# Patient Record
Sex: Female | Born: 1985
Health system: Southern US, Community
[De-identification: ages and names within clinical notes are randomized; demographics above are authoritative.]

## PROBLEM LIST (undated history)

## (undated) DIAGNOSIS — Z8049 Family history of malignant neoplasm of other genital organs: Secondary | ICD-10-CM

## (undated) DIAGNOSIS — Z8371 Family history of colonic polyps: Secondary | ICD-10-CM

## (undated) DIAGNOSIS — Z803 Family history of malignant neoplasm of breast: Secondary | ICD-10-CM

## (undated) DIAGNOSIS — D481 Neoplasm of uncertain behavior of connective and other soft tissue: Secondary | ICD-10-CM

## (undated) HISTORY — DX: Family history of malignant neoplasm of other genital organs: Z80.49

## (undated) HISTORY — DX: Neoplasm of uncertain behavior of connective and other soft tissue: D48.1

## (undated) HISTORY — DX: Family history of colonic polyps: Z83.71

## (undated) HISTORY — DX: Family history of malignant neoplasm of breast: Z80.3

## (undated) HISTORY — PX: NO PAST SURGERIES: SHX2092

---

## 2014-01-14 ENCOUNTER — Other Ambulatory Visit (HOSPITAL_COMMUNITY)
Admission: RE | Admit: 2014-01-14 | Discharge: 2014-01-14 | Disposition: A | Discharge status: Home / Self Care | Payer: Managed Care, Other (non HMO) | Source: Ambulatory Visit | Attending: Family Medicine | Admitting: Family Medicine

## 2014-01-14 DIAGNOSIS — Z124 Encounter for screening for malignant neoplasm of cervix: Secondary | ICD-10-CM | POA: Insufficient documentation

## 2014-04-15 ENCOUNTER — Other Ambulatory Visit: Payer: Self-pay | Admitting: Family

## 2014-04-15 DIAGNOSIS — R1032 Left lower quadrant pain: Secondary | ICD-10-CM

## 2014-04-16 ENCOUNTER — Inpatient Hospital Stay (HOSPITAL_COMMUNITY)
Admission: AD | Admit: 2014-04-16 | Discharge: 2014-04-16 | Disposition: A | Discharge status: Home / Self Care | Payer: Medicare HMO | Source: Ambulatory Visit | Attending: Family Medicine | Admitting: Family Medicine

## 2014-04-16 ENCOUNTER — Inpatient Hospital Stay (HOSPITAL_COMMUNITY): Admit: 2014-04-16 | Payer: Medicare HMO

## 2014-04-16 ENCOUNTER — Encounter (HOSPITAL_COMMUNITY): Payer: Self-pay | Admitting: *Deleted

## 2014-04-16 ENCOUNTER — Inpatient Hospital Stay (HOSPITAL_COMMUNITY): Payer: Medicare HMO

## 2014-04-16 DIAGNOSIS — N8320 Unspecified ovarian cysts: Secondary | ICD-10-CM | POA: Insufficient documentation

## 2014-04-16 DIAGNOSIS — N83201 Unspecified ovarian cyst, right side: Secondary | ICD-10-CM

## 2014-04-16 DIAGNOSIS — R1032 Left lower quadrant pain: Secondary | ICD-10-CM | POA: Insufficient documentation

## 2014-04-16 DIAGNOSIS — R109 Unspecified abdominal pain: Secondary | ICD-10-CM

## 2014-04-16 DIAGNOSIS — N83202 Unspecified ovarian cyst, left side: Secondary | ICD-10-CM

## 2014-04-16 LAB — URINALYSIS, ROUTINE W REFLEX MICROSCOPIC
Bilirubin Urine: NEGATIVE
Glucose, UA: NEGATIVE mg/dL
KETONES UR: 40 mg/dL — AB
LEUKOCYTES UA: NEGATIVE
NITRITE: NEGATIVE
Protein, ur: NEGATIVE mg/dL
SPECIFIC GRAVITY, URINE: 1.015 (ref 1.005–1.030)
Urobilinogen, UA: 0.2 mg/dL (ref 0.0–1.0)
pH: 6 (ref 5.0–8.0)

## 2014-04-16 LAB — WET PREP, GENITAL
CLUE CELLS WET PREP: NONE SEEN
TRICH WET PREP: NONE SEEN
Yeast Wet Prep HPF POC: NONE SEEN

## 2014-04-16 LAB — CBC
HEMATOCRIT: 39.2 % (ref 36.0–46.0)
Hemoglobin: 14 g/dL (ref 12.0–15.0)
MCH: 34.6 pg — ABNORMAL HIGH (ref 26.0–34.0)
MCHC: 35.7 g/dL (ref 30.0–36.0)
MCV: 96.8 fL (ref 78.0–100.0)
Platelets: 247 10*3/uL (ref 150–400)
RBC: 4.05 MIL/uL (ref 3.87–5.11)
RDW: 11.9 % (ref 11.5–15.5)
WBC: 6.4 10*3/uL (ref 4.0–10.5)

## 2014-04-16 LAB — HCG, SERUM, QUALITATIVE: PREG SERUM: NEGATIVE

## 2014-04-16 LAB — POCT PREGNANCY, URINE: PREG TEST UR: NEGATIVE

## 2014-04-16 LAB — URINE MICROSCOPIC-ADD ON

## 2014-04-16 MED ORDER — ONDANSETRON 8 MG PO TBDP
8.0000 mg | ORAL_TABLET | Freq: Once | ORAL | Status: AC
  Administered 2014-04-16: 8 mg via ORAL
  Filled 2014-04-16: qty 1

## 2014-04-16 MED ORDER — IOHEXOL 300 MG/ML  SOLN: 50.0000 mL | INTRAMUSCULAR | Status: DC

## 2014-04-16 MED ORDER — IOHEXOL 300 MG/ML  SOLN
100.0000 mL | Freq: Once | INTRAMUSCULAR | Status: AC | PRN
  Administered 2014-04-16: 100 mL via INTRAVENOUS

## 2014-04-16 MED ORDER — OXYCODONE-ACETAMINOPHEN 5-325 MG PO TABS: 2.0000 | ORAL_TABLET | ORAL | Status: DC | PRN

## 2014-04-16 MED ORDER — KETOROLAC TROMETHAMINE 60 MG/2ML IM SOLN
60.0000 mg | Freq: Once | INTRAMUSCULAR | Status: AC
  Administered 2014-04-16: 60 mg via INTRAMUSCULAR
  Filled 2014-04-16: qty 2

## 2014-04-16 MED ORDER — SODIUM CHLORIDE 0.9 % IV SOLN
INTRAVENOUS | Status: DC
  Administered 2014-04-16: 18:00:00 via INTRAVENOUS

## 2014-04-16 NOTE — MAU Note (Signed)
Off and on abd pain. Thought had gi bug few wks ago because had diarrhea. Felt better for awhile and then pain started about 10 days ago but then improved. Felt ok last wk and pain started again SUnday night. Saw Fam Pract yesterday and given Ibuprofen 800mg  and Tramadol. Scheduled for u/s this Friday. Pain med not helping and pt could not sleep last night. Was told could be ovarian cyst. Pain mostly LLQ. Took morning after pill around 1/15

## 2014-04-16 NOTE — MAU Provider Note (Signed)
History     CSN: [id number]  Arrival date and time: 04/16/14 1226   None     Chief Complaint  Patient presents with  . Abdominal Pain   HPI Pt is a 29y/o female who presents to the clinic for abdominal pain. States the pain began on Sunday while returning from a trip to [place]. She experienced LLQ pain that was moderate-severe in nature and constant. She went and saw NP at her [facility] ([place]) on Monday and discussed the possibility of an ovarian cyst. She was prescribed 800mg  Ibuprofen and Tramadol. Currently feeling discomfort. Not on any contraceptives. Reports taking the morning after pill last month. Mild nausea, no vomiting, fever. No other complaints noted.     LMP- 04/09/14    History reviewed. No pertinent past medical history.  History reviewed. No pertinent past surgical history.  History reviewed. No pertinent family history.  History  Substance Use Topics  . Smoking status: Never Smoker   . Smokeless tobacco: Never Used  . Alcohol Use: Yes    Allergies: Not on File  No prescriptions prior to admission    Review of Systems  Constitutional: Negative for fever.  Respiratory: Negative for cough and shortness of breath.   Cardiovascular: Negative for chest pain.  Gastrointestinal: Positive for nausea and abdominal pain. Negative for vomiting, diarrhea and constipation.  Genitourinary: Negative for dysuria.   Physical Exam   Blood pressure 140/94, pulse 74, temperature 98.9 F (37.2 C), resp. rate 18, height 5\' 10"  (1.778 m), weight 78.109 kg (172 lb 3.2 oz), last menstrual period 04/09/2014, SpO2 100 %.  Physical Exam  Constitutional: She is oriented to person, place, and time. She appears well-developed and well-nourished.  Cardiovascular: Normal rate and regular rhythm.   Respiratory: Effort normal and breath sounds normal.  GI: Soft. Bowel sounds are normal. She exhibits no distension and no mass. There is tenderness.  There is no rebound and no guarding.  Genitourinary: Vagina normal. Uterus is enlarged (10-12 week size broad filling base of pelvis). Uterus is not tender. Cervix exhibits no motion tenderness. Right adnexum displays tenderness. Right adnexum displays no mass. Left adnexum displays no mass and no tenderness.  Neurological: She is alert and oriented to person, place, and time.  Skin: Skin is warm and dry.    MAU Course  Procedures Results for orders placed or performed during the hospital encounter of 04/16/14 (from the past 24 hour(s))  Urinalysis, Routine w reflex microscopic     Status: Abnormal   Collection Time: 04/16/14  1:00 PM  Result Value Ref Range   Color, Urine YELLOW YELLOW   APPearance CLEAR CLEAR   Specific Gravity, Urine 1.015 1.005 - 1.030   pH 6.0 5.0 - 8.0   Glucose, UA NEGATIVE NEGATIVE mg/dL   Hgb urine dipstick TRACE (A) NEGATIVE   Bilirubin Urine NEGATIVE NEGATIVE   Ketones, ur 40 (A) NEGATIVE mg/dL   Protein, ur NEGATIVE NEGATIVE mg/dL   Urobilinogen, UA 0.2 0.0 - 1.0 mg/dL   Nitrite NEGATIVE NEGATIVE   Leukocytes, UA NEGATIVE NEGATIVE  Urine microscopic-add on     Status: Abnormal   Collection Time: 04/16/14  1:00 PM  Result Value Ref Range   Squamous Epithelial / LPF FEW (A) RARE   WBC, UA 3-6 <3 WBC/hpf   RBC / HPF 3-6 <3 RBC/hpf   Bacteria, UA FEW (A) RARE  Pregnancy, urine POC     Status: None   Collection Time: 04/16/14  1:10 PM  Result Value Ref Range   Preg Test, Ur NEGATIVE NEGATIVE  CBC     Status: Abnormal   Collection Time: 04/16/14  1:35 PM  Result Value Ref Range   WBC 6.4 4.0 - 10.5 K/uL   RBC 4.05 3.87 - 5.11 MIL/uL   Hemoglobin 14.0 12.0 - 15.0 g/dL   HCT 39.2 36.0 - 46.0 %   MCV 96.8 78.0 - 100.0 fL   MCH 34.6 (H) 26.0 - 34.0 pg   MCHC 35.7 30.0 - 36.0 g/dL   RDW 11.9 11.5 - 15.5 %   Platelets 247 150 - 400 K/uL  Wet prep, genital     Status: Abnormal   Collection Time: 04/16/14  2:11 PM  Result Value Ref Range   Yeast Wet  Prep HPF POC NONE SEEN NONE SEEN   Trich, Wet Prep NONE SEEN NONE SEEN   Clue Cells Wet Prep HPF POC NONE SEEN NONE SEEN   WBC, Wet Prep HPF POC MODERATE (A) NONE SEEN  hCG, serum, qualitative     Status: None   Collection Time: 04/16/14  2:20 PM  Result Value Ref Range   Preg, Serum NEGATIVE NEGATIVE  CA-125 pending US Transvaginal Non-ob  04/16/2014   CLINICAL DATA:  Left lower quadrant pain  EXAM: TRANSABDOMINAL AND TRANSVAGINAL ULTRASOUND OF PELVIS  TECHNIQUE: Both transabdominal and transvaginal ultrasound examinations of the pelvis were performed. Transabdominal technique was performed for global imaging of the pelvis including uterus, ovaries, adnexal regions, and pelvic cul-de-sac. It was necessary to proceed with endovaginal exam following the transabdominal exam to visualize the uterus and bilateral adnexa.  COMPARISON:  None  FINDINGS: Uterus  Measurements: 6.7 x 2.5 x 3.4 cm. No fibroids or other mass visualized.  Endometrium  Thickness: 9 mm.  No focal abnormality visualized.  Right ovary  Not discretely visualized. Shadowing echogenic/hemorrhagic lesion in the right adnexa, measuring approximately 13.9 x 7.6 x 11.9 cm. This appearance is suspicious for a dermoid.  Left ovary  Complex cystic/hemorrhagic lesion in the left adnexa, measuring 8.6 x 4.7 x 9.8 cm, with associated shadowing echogenic component (image 60). This appearance is also suspicious for a dermoid or less likely an endometrioma. Venous and arterial Doppler flow is demonstrated within adjacent ovarian parenchyma.  Other findings  No free fluid.  IMPRESSION: 13.9 cm complex right adnexal mass, suspicious for ovarian dermoid. Ovarian tissue is not identified.  9.8 cm complex cystic/solid left adnexal mass, suspicious for ovarian dermoid versus endometrioma. Patent spectral Doppler flow within adjacent ovarian parenchyma.  Consider CT or MR pelvis with contrast for further characterization.   Electronically Signed   By: Julian Hy M.D.   On: 04/16/2014 16:21   US Pelvis Complete  04/16/2014   CLINICAL DATA:  Left lower quadrant pain  EXAM: TRANSABDOMINAL AND TRANSVAGINAL ULTRASOUND OF PELVIS  TECHNIQUE: Both transabdominal and transvaginal ultrasound examinations of the pelvis were performed. Transabdominal technique was performed for global imaging of the pelvis including uterus, ovaries, adnexal regions, and pelvic cul-de-sac. It was necessary to proceed with endovaginal exam following the transabdominal exam to visualize the uterus and bilateral adnexa.  COMPARISON:  None  FINDINGS: Uterus  Measurements: 6.7 x 2.5 x 3.4 cm. No fibroids or other mass visualized.  Endometrium  Thickness: 9 mm.  No focal abnormality visualized.  Right ovary  Not discretely visualized. Shadowing echogenic/hemorrhagic lesion in the right adnexa, measuring approximately 13.9 x 7.6 x 11.9 cm. This appearance is suspicious for a dermoid.  Left ovary  Complex  cystic/hemorrhagic lesion in the left adnexa, measuring 8.6 x 4.7 x 9.8 cm, with associated shadowing echogenic component (image 60). This appearance is also suspicious for a dermoid or less likely an endometrioma. Venous and arterial Doppler flow is demonstrated within adjacent ovarian parenchyma.  Other findings  No free fluid.  IMPRESSION: 13.9 cm complex right adnexal mass, suspicious for ovarian dermoid. Ovarian tissue is not identified.  9.8 cm complex cystic/solid left adnexal mass, suspicious for ovarian dermoid versus endometrioma. Patent spectral Doppler flow within adjacent ovarian parenchyma.  Consider CT or MR pelvis with contrast for further characterization.   Electronically Signed   By: Julian Hy M.D.   On: 04/16/2014 16:21  discussed with Dr. Nehemiah Settle- will have pt have CT of abdomen and pelvis with contrast and f/u in clinic CT was performed while pt was inpatient- results pending  Assessment and Plan   bilateral ovarian cysts CT today F/u in clinic tomorrow  04/17/2014 at 3 pm to review results of imaging and management - note sent to [facility] Rx for Percocet given if needed for pain Pt has phenergan at home    Ozella Rocks, [place] 04/16/2014 at 2:25 PM

## 2014-04-17 ENCOUNTER — Ambulatory Visit (INDEPENDENT_AMBULATORY_CARE_PROVIDER_SITE_OTHER): Payer: Medicare HMO | Admitting: Obstetrics & Gynecology

## 2014-04-17 ENCOUNTER — Encounter: Payer: Self-pay | Admitting: Obstetrics & Gynecology

## 2014-04-17 VITALS — BP 125/70 | HR 77 | Temp 98.1°F | Ht 70.0 in | Wt 168.5 lb

## 2014-04-17 DIAGNOSIS — N8320 Unspecified ovarian cysts: Secondary | ICD-10-CM

## 2014-04-17 DIAGNOSIS — D271 Benign neoplasm of left ovary: Secondary | ICD-10-CM | POA: Diagnosis not present

## 2014-04-17 DIAGNOSIS — D270 Benign neoplasm of right ovary: Secondary | ICD-10-CM

## 2014-04-17 DIAGNOSIS — N83202 Unspecified ovarian cyst, left side: Principal | ICD-10-CM

## 2014-04-17 DIAGNOSIS — N83201 Unspecified ovarian cyst, right side: Secondary | ICD-10-CM

## 2014-04-17 LAB — GC/CHLAMYDIA PROBE AMP (~~LOC~~) NOT AT ARMC
Chlamydia: NEGATIVE
NEISSERIA GONORRHEA: NEGATIVE

## 2014-04-17 LAB — HIV ANTIBODY (ROUTINE TESTING W REFLEX): HIV Screen 4th Generation wRfx: NONREACTIVE

## 2014-04-17 LAB — RPR: RPR Ser Ql: NONREACTIVE

## 2014-04-17 LAB — CA 125: CA 125: 32.9 U/mL (ref 0.0–34.0)

## 2014-04-17 NOTE — Progress Notes (Signed)
Subjective:     Patient ID: Lisa Cline, female   DOB: [birth year], 29 y.o.   MRN: [id number]  HPI Pt presents as ED f/u.  She reports that she has new onset pelvic pain.   She denies fever or chills.  She reports that prior to this weekend she did not have pain.  She reports that over the past several days her appetite has decreased. She had to stop working yesterday and today due to the pain.   History reviewed. No pertinent past medical history.  Past Surgical History  Procedure Laterality Date  . No past surgeries     Current Outpatient Prescriptions on File Prior to Visit  Medication Sig Dispense Refill  . CALCIUM-MAGNESIUM-ZINC PO Take 3 tablets by mouth daily.    Marland Kitchen ibuprofen (ADVIL,MOTRIN) 800 MG tablet Take 800 mg by mouth every 8 (eight) hours as needed for mild pain.    . Multiple Vitamin (MULTIVITAMIN WITH MINERALS) TABS tablet Take 1 tablet by mouth daily.    Marland Kitchen omega-3 acid ethyl esters (LOVAZA) 1 G capsule Take 2 g by mouth daily.    Marland Kitchen oxyCODONE-acetaminophen (PERCOCET/ROXICET) 5-325 MG per tablet Take 2 tablets by mouth every 4 (four) hours as needed for severe pain. 15 tablet 0  . Probiotic Product (PROBIOTIC PO) Take 1 capsule by mouth daily.    . traMADol (ULTRAM) 50 MG tablet Take 50 mg by mouth every 6 (six) hours as needed for moderate pain.     No current facility-administered medications on file prior to visit.   History   Social History  . Marital Status: Unknown    Spouse Name: N/A  . Number of Children: N/A  . Years of Education: N/A   Occupational History  . Not on file.   Social History Main Topics  . Smoking status: Never Smoker   . Smokeless tobacco: Never Used  . Alcohol Use: Yes     Comment: socially  . Drug Use: No  . Sexual Activity: Yes    Birth Control/ Protection: Condom   Other Topics Concern  . Not on file   Social History Narrative   No Known Allergies      Review of Systems     Objective:   Physical Exam BP 125/70  mmHg  Pulse 77  Temp(Src) 98.1 F (36.7 C) (Oral)  Ht 5\' 10"  (1.778 m)  Wt 168 lb 8 oz (76.431 kg)  BMI 24.18 kg/m2  LMP 04/09/2014 Pt in NAD Lungs: CTA CV: RRR Abd: soft, ND, mass effect palpated.  GU: EGBUS: no lesions Vagina: no blood in vault Cervix: no lesion; no mucopurulent d/c; no CMT Uterus: small, mobile Adnexa: mobile masses palpated bilaterally; pt uncomfortable after exam; adnexa tender to palpation      04/16/2014 CLINICAL DATA: Left lower quadrant pain  EXAM: TRANSABDOMINAL AND TRANSVAGINAL ULTRASOUND OF PELVIS  TECHNIQUE: Both transabdominal and transvaginal ultrasound examinations of the pelvis were performed. Transabdominal technique was performed for global imaging of the pelvis including uterus, ovaries, adnexal regions, and pelvic cul-de-sac. It was necessary to proceed with endovaginal exam following the transabdominal exam to visualize the uterus and bilateral adnexa.  COMPARISON: None  FINDINGS: Uterus  Measurements: 6.7 x 2.5 x 3.4 cm. No fibroids or other mass visualized.  Endometrium  Thickness: 9 mm. No focal abnormality visualized.  Right ovary  Not discretely visualized. Shadowing echogenic/hemorrhagic lesion in the right adnexa, measuring approximately 13.9 x 7.6 x 11.9 cm. This appearance is suspicious for a dermoid.  Left  ovary  Complex cystic/hemorrhagic lesion in the left adnexa, measuring 8.6 x 4.7 x 9.8 cm, with associated shadowing echogenic component (image 60). This appearance is also suspicious for a dermoid or less likely an endometrioma. Venous and arterial Doppler flow is demonstrated within adjacent ovarian parenchyma.  Other findings  No free fluid.  IMPRESSION: 13.9 cm complex right adnexal mass, suspicious for ovarian dermoid. Ovarian tissue is not identified.  9.8 cm complex cystic/solid left adnexal mass, suspicious for ovarian dermoid versus endometrioma. Patent spectral Doppler  flow within adjacent ovarian parenchyma.  Consider CT or MR pelvis with contrast for further characterization.   04/16/2014 CLINICAL DATA: Low abdominal pain for 1 month, worsening over 3 days. Right adnexal mass suspicious for ovarian dermoid demonstrated on ultrasound today.  EXAM: CT ABDOMEN AND PELVIS WITH CONTRAST  TECHNIQUE: Multidetector CT imaging of the abdomen and pelvis was performed using the standard protocol following bolus administration of intravenous contrast.  CONTRAST: 185mL OMNIPAQUE IOHEXOL 300 MG/ML SOLN  COMPARISON: Ultrasound pelvis 04/16/2014  FINDINGS: Lung bases are clear.  The liver, spleen, gallbladder, pancreas, adrenal glands, kidneys, abdominal aorta, inferior vena cava, and retroperitoneal lymph nodes are unremarkable. Stomach, small bowel, and colon are not abnormally distended. No free air or free fluid in the abdomen.  Pelvis: There is a large anterior mass in the pelvis appearing to arise from the right adnexal region. The mass contains fat fluid level with internal debris and calcification consistent with a dermoid cyst. The lesion measures about 10.2 x 8.5 cm in transverse diameter. Additional fat and soft tissue changes demonstrated posterior to the uterus and measuring about 8.8 x 5.8 cm. This may be lobular continuation of the right adnexal lesion or could represent an additional left adnexal dermoid. No free or loculated pelvic fluid collections. The appendix is normal. No destructive bone lesions.  IMPRESSION: Large anterior and posterior pelvic mass lesions containing fat, soft tissue, internal debris, and calcification. Appearance likely represents bilateral ovarian dermoid cysts.      Assessment:    Bilateral large dermoid cysts-  Reviewed with pt necessity of operative treatment as these are not functional cysts     Plan:     CA 125 today Patient desires surgical management with laparotomy with  bilateral ovarian cystectomies.  The risks of surgery were discussed in detail with the patient including but not limited to: bleeding which may require transfusion or reoperation; infection which may require prolonged hospitalization or re-hospitalization and antibiotic therapy; injury to bowel, bladder, ureters and major vessels or other surrounding organs; need for additional procedures including laparotomy; thromboembolic phenomenon, incisional problems and other postoperative or anesthesia complications.  Patient was told that the likelihood that her condition and symptoms will be treated effectively with this surgical management was very high; the postoperative expectations were also discussed in detail. The patient also understands the alternative treatment options which were discussed in full. All questions were answered.  She was told that she will be contacted by our surgical scheduler regarding the time and date of her surgery; routine preoperative instructions of having nothing to eat or drink after midnight on the day prior to surgery and also coming to the hospital 1 1/2 hours prior to her time of surgery were also emphasized.  She was told she may be called for a preoperative appointment about a week prior to surgery and will be given further preoperative instructions at that visit. Printed patient education handouts about the procedure were given to the patient to review at  home.

## 2014-04-17 NOTE — Patient Instructions (Addendum)
Laparotomy Care After Refer to this sheet in the next few weeks. These instructions provide you with information on caring for yourself after your procedure. Your caregiver may also give you more specific instructions. Your treatment has been planned according to current medical practices, but problems sometimes occur. Call your caregiver if you have any problems or questions after your procedure. HOME CARE INSTRUCTIONS ACTIVITY  Rest as much as possible the first two weeks at home.  Avoid strenuous activity such as heavy lifting (more than 10 pounds), pushing, or pulling. Limit stair climbing to once or twice a day for the first week, then slowly increase this activity.  Take frequent rest periods throughout the day.  Talk with your caregiver about when you may resume your usual physical activity.  You need to be out of bed and walking as much as possible. This decreases the chance of:  Blood clots.  Pneumonia. NUTRITION  You can resume your normal diet once you regain bowel function.  Drink plenty of fluids (6-8 glasses a day or as instructed by your caregiver).  Eat a well-balanced diet.  Daily portions of food from the meat (protein), milk, vegetable, and bread groups are necessary for your health. ELIMINATION It is very important not to strain during bowel movements. If constipation should occur, you may:  Take a mild laxative.  Add fruit and bran to your diet.  Drink more fluids. HYGIENE  Take showers, not baths, until 4-6 weeks after surgery.  If your incision is closed, you may take a shower or tub bath. FEVER If you feel feverish or have shaking chills, take your temperature. If it is 102 F (38.9 C), call your caregiver. The fever may mean there is an infection. PAIN CONTROL  Mild discomfort may occur.  Only take over-the-counter or prescription medicines for pain, discomfort, or fever as directed by your caregiver. Take any prescribed medicines exactly as  directed. INCISION CARE  Keep your incision site clean with soap and water.  Do not use a dressing unless your cut (incision) from surgery is draining or irritated.  If you have small adhesive strips in place and they do not fall off within 10 days, carefully peel them off.  Check your incision and surrounding area daily for any redness, swelling, discoloration, heavy drainage, or separation of the skin. SEXUAL INTERCOURSE Do not have sexual intercourse until after your follow-up appointment, unless your caregiver tells you otherwise. SEEK MEDICAL CARE IF:   You are unable to tolerate food or drinks.  You are unable to pass gas or have a bowel movement.  Your pain becomes more severe or is not relieved with medicines.  You have redness, swelling, discoloration, heavy drainage, or separation of the skin at the incision site. Document Released: 09/23/2003 Document Revised: 01/26/2012 Document Reviewed: 02/07/2007 [facility] Patient Information 2015 [place], [place]. This information is not intended to replace advice given to you by your health care provider. Make sure you discuss any questions you have with your health care provider. Ovarian Cyst An ovarian cyst is a fluid-filled sac that forms on an ovary. The ovaries are small organs that produce eggs in women. Various types of cysts can form on the ovaries. Most are not cancerous. Many do not cause problems, and they often go away on their own. Some may cause symptoms and require treatment. Common types of ovarian cysts include:  Functional cysts--These cysts may occur every month during the menstrual cycle. This is normal. The cysts usually go away with  the next menstrual cycle if the woman does not get pregnant. Usually, there are no symptoms with a functional cyst.  Endometrioma cysts--These cysts form from the tissue that lines the uterus. They are also called "chocolate cysts" because they become filled with blood that turns brown.  This type of cyst can cause pain in the lower abdomen during intercourse and with your menstrual period.  Cystadenoma cysts--This type develops from the cells on the outside of the ovary. These cysts can get very big and cause lower abdomen pain and pain with intercourse. This type of cyst can twist on itself, cut off its blood supply, and cause severe pain. It can also easily rupture and cause a lot of pain.  Dermoid cysts--This type of cyst is sometimes found in both ovaries. These cysts may contain different kinds of body tissue, such as skin, teeth, hair, or cartilage. They usually do not cause symptoms unless they get very big.  Theca lutein cysts--These cysts occur when too much of a certain hormone (human chorionic gonadotropin) is produced and overstimulates the ovaries to produce an egg. This is most common after procedures used to assist with the conception of a baby (in vitro fertilization). CAUSES   Fertility drugs can cause a condition in which multiple large cysts are formed on the ovaries. This is called ovarian hyperstimulation syndrome.  A condition called polycystic ovary syndrome can cause hormonal imbalances that can lead to nonfunctional ovarian cysts. SIGNS AND SYMPTOMS  Many ovarian cysts do not cause symptoms. If symptoms are present, they may include:  Pelvic pain or pressure.  Pain in the lower abdomen.  Pain during sexual intercourse.  Increasing girth (swelling) of the abdomen.  Abnormal menstrual periods.  Increasing pain with menstrual periods.  Stopping having menstrual periods without being pregnant. DIAGNOSIS  These cysts are commonly found during a routine or annual pelvic exam. Tests may be ordered to find out more about the cyst. These tests may include:  Ultrasound.  X-ray of the pelvis.  CT scan.  MRI.  Blood tests. TREATMENT  Many ovarian cysts go away on their own without treatment. Your health care provider may want to check your cyst  regularly for 2-3 months to see if it changes. For women in menopause, it is particularly important to monitor a cyst closely because of the higher rate of ovarian cancer in menopausal women. When treatment is needed, it may include any of the following:  A procedure to drain the cyst (aspiration). This may be done using a long needle and ultrasound. It can also be done through a laparoscopic procedure. This involves using a thin, lighted tube with a tiny camera on the end (laparoscope) inserted through a small incision.  Surgery to remove the whole cyst. This may be done using laparoscopic surgery or an open surgery involving a larger incision in the lower abdomen.  Hormone treatment or birth control pills. These methods are sometimes used to help dissolve a cyst. HOME CARE INSTRUCTIONS   Only take over-the-counter or prescription medicines as directed by your health care provider.  Follow up with your health care provider as directed.  Get regular pelvic exams and Pap tests. SEEK MEDICAL CARE IF:   Your periods are late, irregular, or painful, or they stop.  Your pelvic pain or abdominal pain does not go away.  Your abdomen becomes larger or swollen.  You have pressure on your bladder or trouble emptying your bladder completely.  You have pain during sexual intercourse.  You have feelings of fullness, pressure, or discomfort in your stomach.  You lose weight for no apparent reason.  You feel generally ill.  You become constipated.  You lose your appetite.  You develop acne.  You have an increase in body and facial hair.  You are gaining weight, without changing your exercise and eating habits.  You think you are pregnant. SEEK IMMEDIATE MEDICAL CARE IF:   You have increasing abdominal pain.  You feel sick to your stomach (nauseous), and you throw up (vomit).  You develop a fever that comes on suddenly.  You have abdominal pain during a bowel movement.  Your  menstrual periods become heavier than usual. MAKE SURE YOU:  Understand these instructions.  Will watch your condition.  Will get help right away if you are not doing well or get worse. Document Released: 02/08/2005 Document Revised: 02/13/2013 Document Reviewed: 10/16/2012 [facility] Patient Information 2015 [place], [place]. This information is not intended to replace advice given to you by your health care provider. Make sure you discuss any questions you have with your health care provider.

## 2014-04-18 ENCOUNTER — Encounter (HOSPITAL_COMMUNITY): Payer: Self-pay | Admitting: *Deleted

## 2014-04-18 DIAGNOSIS — D270 Benign neoplasm of right ovary: Secondary | ICD-10-CM | POA: Insufficient documentation

## 2014-04-18 DIAGNOSIS — D271 Benign neoplasm of left ovary: Secondary | ICD-10-CM

## 2014-04-18 LAB — CA 125: CA 125: 27 U/mL (ref <35)

## 2014-04-19 ENCOUNTER — Other Ambulatory Visit: Payer: Managed Care, Other (non HMO)

## 2014-04-19 ENCOUNTER — Telehealth: Payer: Self-pay | Admitting: *Deleted

## 2014-04-19 ENCOUNTER — Other Ambulatory Visit: Payer: Medicare HMO

## 2014-04-19 NOTE — Telephone Encounter (Addendum)
Received a voicemail message from [place] from [place] stating she is calling regarding this patient who is a patient of Dr. Lavonia Drafts. States the patient has taken a leave and she needs to verify medical information so she can get her leave.  Needs Diagnosis and ICD 10 codes, tests completed verifying diagnosis, treatment and plan of care and dates of visits.   Called [place] and informed her we need a signed release before we can release any medical information.

## 2014-04-22 MED ORDER — DEXTROSE 5 % IV SOLN
2.0000 g | INTRAVENOUS | Status: AC
  Administered 2014-04-23: 2 g via INTRAVENOUS
  Filled 2014-04-22: qty 2

## 2014-04-23 ENCOUNTER — Inpatient Hospital Stay (HOSPITAL_COMMUNITY): Payer: Managed Care, Other (non HMO) | Admitting: Anesthesiology

## 2014-04-23 ENCOUNTER — Inpatient Hospital Stay (HOSPITAL_COMMUNITY)
Admission: RE | Admit: 2014-04-23 | Discharge: 2014-04-25 | DRG: 742 | Disposition: A | Discharge status: Home / Self Care | Payer: Managed Care, Other (non HMO) | Source: Ambulatory Visit | Attending: Obstetrics & Gynecology | Admitting: Obstetrics & Gynecology

## 2014-04-23 ENCOUNTER — Encounter (HOSPITAL_COMMUNITY): Payer: Self-pay | Admitting: Obstetrics & Gynecology

## 2014-04-23 ENCOUNTER — Encounter (HOSPITAL_COMMUNITY)
Admission: RE | Disposition: A | Discharge status: Home / Self Care | Payer: Self-pay | Source: Ambulatory Visit | Attending: Obstetrics & Gynecology

## 2014-04-23 DIAGNOSIS — D270 Benign neoplasm of right ovary: Secondary | ICD-10-CM | POA: Diagnosis present

## 2014-04-23 DIAGNOSIS — D271 Benign neoplasm of left ovary: Principal | ICD-10-CM | POA: Diagnosis present

## 2014-04-23 DIAGNOSIS — N8353 Torsion of ovary, ovarian pedicle and fallopian tube: Secondary | ICD-10-CM | POA: Diagnosis present

## 2014-04-23 DIAGNOSIS — Z9889 Other specified postprocedural states: Secondary | ICD-10-CM

## 2014-04-23 HISTORY — PX: LAPAROTOMY: SHX154

## 2014-04-23 HISTORY — PX: OVARIAN CYST REMOVAL: SHX89

## 2014-04-23 HISTORY — PX: SALPINGOOPHORECTOMY: SHX82

## 2014-04-23 LAB — CBC
HCT: 33.5 % — ABNORMAL LOW (ref 36.0–46.0)
Hemoglobin: 12.2 g/dL (ref 12.0–15.0)
MCH: 34.3 pg — AB (ref 26.0–34.0)
MCHC: 36.4 g/dL — ABNORMAL HIGH (ref 30.0–36.0)
MCV: 94.1 fL (ref 78.0–100.0)
Platelets: 280 10*3/uL (ref 150–400)
RBC: 3.56 MIL/uL — ABNORMAL LOW (ref 3.87–5.11)
RDW: 11.5 % (ref 11.5–15.5)
WBC: 10.7 10*3/uL — AB (ref 4.0–10.5)

## 2014-04-23 LAB — TYPE AND SCREEN
ABO/RH(D): O POS
Antibody Screen: NEGATIVE

## 2014-04-23 LAB — ABO/RH: ABO/RH(D): O POS

## 2014-04-23 SURGERY — LAPAROTOMY
Anesthesia: General | Site: Abdomen | Laterality: Right

## 2014-04-23 MED ORDER — HYDROMORPHONE HCL 1 MG/ML IJ SOLN
INTRAMUSCULAR | Status: AC
  Filled 2014-04-23: qty 1

## 2014-04-23 MED ORDER — ONDANSETRON HCL 4 MG/2ML IJ SOLN: 4.0000 mg | Freq: Four times a day (QID) | INTRAMUSCULAR | Status: DC | PRN

## 2014-04-23 MED ORDER — BUPIVACAINE LIPOSOME 1.3 % IJ SUSP
20.0000 mL | Freq: Once | INTRAMUSCULAR | Status: DC
  Filled 2014-04-23: qty 20

## 2014-04-23 MED ORDER — FENTANYL CITRATE 0.05 MG/ML IJ SOLN
INTRAMUSCULAR | Status: AC
  Administered 2014-04-23: 50 ug via INTRAVENOUS
  Filled 2014-04-23: qty 2

## 2014-04-23 MED ORDER — BUPIVACAINE HCL (PF) 0.5 % IJ SOLN
INTRAMUSCULAR | Status: AC
  Filled 2014-04-23: qty 30

## 2014-04-23 MED ORDER — LACTATED RINGERS IV SOLN: INTRAVENOUS | Status: DC

## 2014-04-23 MED ORDER — FENTANYL CITRATE 0.05 MG/ML IJ SOLN
INTRAMUSCULAR | Status: DC | PRN
  Administered 2014-04-23 (×3): 50 ug via INTRAVENOUS
  Administered 2014-04-23 (×2): 100 ug via INTRAVENOUS

## 2014-04-23 MED ORDER — NEOSTIGMINE METHYLSULFATE 10 MG/10ML IV SOLN
INTRAVENOUS | Status: AC
  Filled 2014-04-23: qty 1

## 2014-04-23 MED ORDER — FENTANYL CITRATE 0.05 MG/ML IJ SOLN
INTRAMUSCULAR | Status: AC
  Filled 2014-04-23: qty 5

## 2014-04-23 MED ORDER — LACTATED RINGERS IV SOLN
INTRAVENOUS | Status: DC
  Administered 2014-04-23 (×2): via INTRAVENOUS

## 2014-04-23 MED ORDER — FENTANYL CITRATE 0.05 MG/ML IJ SOLN
INTRAMUSCULAR | Status: AC
  Filled 2014-04-23: qty 2

## 2014-04-23 MED ORDER — ONDANSETRON HCL 4 MG/2ML IJ SOLN: 4.0000 mg | Freq: Once | INTRAMUSCULAR | Status: DC | PRN

## 2014-04-23 MED ORDER — OXYCODONE-ACETAMINOPHEN 5-325 MG PO TABS
1.0000 | ORAL_TABLET | ORAL | Status: DC | PRN
  Administered 2014-04-24 – 2014-04-25 (×3): 1 via ORAL
  Filled 2014-04-23 (×3): qty 1

## 2014-04-23 MED ORDER — ONDANSETRON HCL 4 MG/2ML IJ SOLN
INTRAMUSCULAR | Status: AC
  Filled 2014-04-23: qty 2

## 2014-04-23 MED ORDER — PROPOFOL 10 MG/ML IV BOLUS
INTRAVENOUS | Status: AC
  Filled 2014-04-23: qty 20

## 2014-04-23 MED ORDER — BUPIVACAINE HCL (PF) 0.25 % IJ SOLN
INTRAMUSCULAR | Status: AC
  Filled 2014-04-23: qty 30

## 2014-04-23 MED ORDER — SCOPOLAMINE 1 MG/3DAYS TD PT72
1.0000 | MEDICATED_PATCH | Freq: Once | TRANSDERMAL | Status: DC
  Administered 2014-04-23: 1.5 mg via TRANSDERMAL

## 2014-04-23 MED ORDER — HYDROMORPHONE HCL 1 MG/ML IJ SOLN: 0.2000 mg | INTRAMUSCULAR | Status: DC | PRN

## 2014-04-23 MED ORDER — DEXAMETHASONE SODIUM PHOSPHATE 10 MG/ML IJ SOLN
INTRAMUSCULAR | Status: AC
  Filled 2014-04-23: qty 1

## 2014-04-23 MED ORDER — GLYCOPYRROLATE 0.2 MG/ML IJ SOLN
INTRAMUSCULAR | Status: DC | PRN
  Administered 2014-04-23: 0.2 mg via INTRAVENOUS
  Administered 2014-04-23: .6 mg via INTRAVENOUS

## 2014-04-23 MED ORDER — SCOPOLAMINE 1 MG/3DAYS TD PT72
MEDICATED_PATCH | TRANSDERMAL | Status: DC
Start: 2014-04-23 — End: 2014-04-25
  Administered 2014-04-23: 1.5 mg via TRANSDERMAL
  Filled 2014-04-23: qty 1

## 2014-04-23 MED ORDER — IBUPROFEN 800 MG PO TABS
800.0000 mg | ORAL_TABLET | Freq: Three times a day (TID) | ORAL | Status: DC | PRN
  Administered 2014-04-24 – 2014-04-25 (×2): 800 mg via ORAL
  Filled 2014-04-23 (×2): qty 1

## 2014-04-23 MED ORDER — ONDANSETRON HCL 4 MG PO TABS: 4.0000 mg | ORAL_TABLET | Freq: Four times a day (QID) | ORAL | Status: DC | PRN

## 2014-04-23 MED ORDER — KETOROLAC TROMETHAMINE 30 MG/ML IJ SOLN: 30.0000 mg | Freq: Four times a day (QID) | INTRAMUSCULAR | Status: DC

## 2014-04-23 MED ORDER — LIDOCAINE HCL (CARDIAC) 20 MG/ML IV SOLN
INTRAVENOUS | Status: AC
Start: 2014-04-23 — End: 2014-04-23
  Filled 2014-04-23: qty 5

## 2014-04-23 MED ORDER — NEOSTIGMINE METHYLSULFATE 10 MG/10ML IV SOLN
INTRAVENOUS | Status: DC | PRN
  Administered 2014-04-23: 4 mg via INTRAVENOUS

## 2014-04-23 MED ORDER — 0.9 % SODIUM CHLORIDE (POUR BTL) OPTIME
TOPICAL | Status: DC | PRN
  Administered 2014-04-23: 2000 mL

## 2014-04-23 MED ORDER — PROPOFOL 10 MG/ML IV BOLUS
INTRAVENOUS | Status: DC | PRN
  Administered 2014-04-23: 150 mg via INTRAVENOUS

## 2014-04-23 MED ORDER — BUPIVACAINE HCL (PF) 0.25 % IJ SOLN
INTRAMUSCULAR | Status: DC | PRN
  Administered 2014-04-23: 10 mL

## 2014-04-23 MED ORDER — MIDAZOLAM HCL 2 MG/2ML IJ SOLN
INTRAMUSCULAR | Status: AC
  Filled 2014-04-23: qty 2

## 2014-04-23 MED ORDER — ONDANSETRON HCL 4 MG/2ML IJ SOLN
INTRAMUSCULAR | Status: DC | PRN
  Administered 2014-04-23: 4 mg via INTRAVENOUS

## 2014-04-23 MED ORDER — KETOROLAC TROMETHAMINE 30 MG/ML IJ SOLN
30.0000 mg | Freq: Four times a day (QID) | INTRAMUSCULAR | Status: DC
  Administered 2014-04-24 (×3): 30 mg via INTRAVENOUS
  Filled 2014-04-23 (×3): qty 1

## 2014-04-23 MED ORDER — DOCUSATE SODIUM 100 MG PO CAPS
100.0000 mg | ORAL_CAPSULE | Freq: Two times a day (BID) | ORAL | Status: DC
  Administered 2014-04-23 – 2014-04-25 (×4): 100 mg via ORAL
  Filled 2014-04-23 (×4): qty 1

## 2014-04-23 MED ORDER — SODIUM CHLORIDE 0.9 % IJ SOLN
INTRAMUSCULAR | Status: AC
  Filled 2014-04-23: qty 10

## 2014-04-23 MED ORDER — FENTANYL CITRATE 0.05 MG/ML IJ SOLN
25.0000 ug | INTRAMUSCULAR | Status: DC | PRN
  Administered 2014-04-23 (×2): 50 ug via INTRAVENOUS

## 2014-04-23 MED ORDER — PANTOPRAZOLE SODIUM 40 MG PO TBEC
40.0000 mg | DELAYED_RELEASE_TABLET | Freq: Every day | ORAL | Status: DC
Start: 2014-04-24 — End: 2014-04-25
  Administered 2014-04-24 – 2014-04-25 (×2): 40 mg via ORAL
  Filled 2014-04-23 (×2): qty 1

## 2014-04-23 MED ORDER — GLYCOPYRROLATE 0.2 MG/ML IJ SOLN
INTRAMUSCULAR | Status: AC
  Filled 2014-04-23: qty 4

## 2014-04-23 MED ORDER — DEXTROSE-NACL 5-0.45 % IV SOLN
INTRAVENOUS | Status: DC
  Administered 2014-04-23 – 2014-04-24 (×2): via INTRAVENOUS

## 2014-04-23 MED ORDER — KETOROLAC TROMETHAMINE 30 MG/ML IJ SOLN
INTRAMUSCULAR | Status: AC
  Filled 2014-04-23: qty 1

## 2014-04-23 MED ORDER — DEXAMETHASONE SODIUM PHOSPHATE 10 MG/ML IJ SOLN
INTRAMUSCULAR | Status: DC | PRN
  Administered 2014-04-23: 4 mg via INTRAVENOUS

## 2014-04-23 MED ORDER — ROCURONIUM BROMIDE 100 MG/10ML IV SOLN
INTRAVENOUS | Status: AC
  Filled 2014-04-23: qty 1

## 2014-04-23 MED ORDER — MIDAZOLAM HCL 2 MG/2ML IJ SOLN
INTRAMUSCULAR | Status: DC | PRN
  Administered 2014-04-23: 2 mg via INTRAVENOUS

## 2014-04-23 MED ORDER — SIMETHICONE 80 MG PO CHEW
80.0000 mg | CHEWABLE_TABLET | Freq: Four times a day (QID) | ORAL | Status: DC | PRN
  Administered 2014-04-23 – 2014-04-24 (×2): 80 mg via ORAL
  Filled 2014-04-23 (×2): qty 1

## 2014-04-23 MED ORDER — BUPIVACAINE LIPOSOME 1.3 % IJ SUSP
INTRAMUSCULAR | Status: DC | PRN
  Administered 2014-04-23: 20 mL

## 2014-04-23 MED ORDER — KETOROLAC TROMETHAMINE 30 MG/ML IJ SOLN
30.0000 mg | Freq: Once | INTRAMUSCULAR | Status: AC
  Administered 2014-04-23: 30 mg via INTRAVENOUS

## 2014-04-23 MED ORDER — HYDROMORPHONE HCL 1 MG/ML IJ SOLN
INTRAMUSCULAR | Status: DC | PRN
  Administered 2014-04-23: 1 mg via INTRAVENOUS

## 2014-04-23 MED ORDER — ROCURONIUM BROMIDE 100 MG/10ML IV SOLN
INTRAVENOUS | Status: DC | PRN
  Administered 2014-04-23 (×2): 10 mg via INTRAVENOUS
  Administered 2014-04-23: 40 mg via INTRAVENOUS

## 2014-04-23 SURGICAL SUPPLY — 45 items
BARRIER ADHS 3X4 INTERCEED (GAUZE/BANDAGES/DRESSINGS) ×5 IMPLANT
BENZOIN TINCTURE PRP APPL 2/3 (GAUZE/BANDAGES/DRESSINGS) ×5 IMPLANT
BLADE SURG 10 STRL SS (BLADE) ×10 IMPLANT
CANISTER SUCT 3000ML (MISCELLANEOUS) ×5 IMPLANT
CELLS DAT CNTRL 66122 CELL SVR (MISCELLANEOUS) IMPLANT
CHLORAPREP W/TINT 26ML (MISCELLANEOUS) ×5 IMPLANT
CLOSURE WOUND 1/2 X4 (GAUZE/BANDAGES/DRESSINGS) ×1
CLOTH BEACON ORANGE TIMEOUT ST (SAFETY) ×5 IMPLANT
CONT PATH 16OZ SNAP LID 3702 (MISCELLANEOUS) ×5 IMPLANT
DECANTER SPIKE VIAL GLASS SM (MISCELLANEOUS) IMPLANT
DRAPE WARM FLUID 44X44 (DRAPE) ×5 IMPLANT
DRSG OPSITE POSTOP 4X10 (GAUZE/BANDAGES/DRESSINGS) ×5 IMPLANT
DRSG TELFA 3X8 NADH (GAUZE/BANDAGES/DRESSINGS) ×5 IMPLANT
GAUZE SPONGE 4X4 12PLY STRL (GAUZE/BANDAGES/DRESSINGS) ×5 IMPLANT
GAUZE SPONGE 4X4 16PLY XRAY LF (GAUZE/BANDAGES/DRESSINGS) ×5 IMPLANT
GLOVE BIO SURGEON STRL SZ7 (GLOVE) ×5 IMPLANT
GLOVE BIOGEL PI IND STRL 7.0 (GLOVE) ×3 IMPLANT
GLOVE BIOGEL PI INDICATOR 7.0 (GLOVE) ×2
GOWN STRL REUS W/TWL LRG LVL3 (GOWN DISPOSABLE) ×10 IMPLANT
NEEDLE HYPO 25X1 1.5 SAFETY (NEEDLE) ×5 IMPLANT
NS IRRIG 1000ML POUR BTL (IV SOLUTION) ×5 IMPLANT
PACK ABDOMINAL GYN (CUSTOM PROCEDURE TRAY) ×5 IMPLANT
PAD ABD 7.5X8 STRL (GAUZE/BANDAGES/DRESSINGS) ×10 IMPLANT
PAD OB MATERNITY 4.3X12.25 (PERSONAL CARE ITEMS) ×5 IMPLANT
PROTECTOR NERVE ULNAR (MISCELLANEOUS) ×5 IMPLANT
RETRACTOR WND ALEXIS 25 LRG (MISCELLANEOUS) IMPLANT
RTRCTR WOUND ALEXIS 18CM MED (MISCELLANEOUS)
RTRCTR WOUND ALEXIS 25CM LRG (MISCELLANEOUS)
SPONGE LAP 18X18 X RAY DECT (DISPOSABLE) ×10 IMPLANT
STAPLER VISISTAT 35W (STAPLE) ×5 IMPLANT
STRIP CLOSURE SKIN 1/2X4 (GAUZE/BANDAGES/DRESSINGS) ×4 IMPLANT
SUT VIC AB 0 CT1 18XCR BRD8 (SUTURE) ×3 IMPLANT
SUT VIC AB 0 CT1 27 (SUTURE) ×4
SUT VIC AB 0 CT1 27XBRD ANBCTR (SUTURE) ×6 IMPLANT
SUT VIC AB 0 CT1 8-18 (SUTURE) ×2
SUT VIC AB 3-0 CT1 27 (SUTURE) ×6
SUT VIC AB 3-0 CT1 TAPERPNT 27 (SUTURE) ×9 IMPLANT
SUT VIC AB 3-0 SH 27 (SUTURE)
SUT VIC AB 3-0 SH 27X BRD (SUTURE) IMPLANT
SUT VIC AB 4-0 KS 27 (SUTURE) ×5 IMPLANT
SUT VICRYL 0 TIES 12 18 (SUTURE) ×5 IMPLANT
SYR CONTROL 10ML LL (SYRINGE) ×5 IMPLANT
TOWEL OR 17X24 6PK STRL BLUE (TOWEL DISPOSABLE) ×10 IMPLANT
TRAY FOLEY CATH 14FR (SET/KITS/TRAYS/PACK) ×5 IMPLANT
WATER STERILE IRR 1000ML POUR (IV SOLUTION) ×5 IMPLANT

## 2014-04-23 NOTE — Brief Op Note (Addendum)
04/23/2014  5:16 PM  PATIENT:  Lisa Cline  29 y.o. female  PRE-OPERATIVE DIAGNOSIS:   Bilateral ovarian dermoid cysts  POST-OPERATIVE DIAGNOSIS:   Left ovarian dermoid with torsion and infarcted tube and ovary and right dermoid cyst   PROCEDURE: laparotomy with Left salpingo-oophorectomy and right ovarian cystectomy    SURGEON:  Surgeon(s) and Role:    * Lavonia Drafts, MD - Primary  ANESTHESIA:   general  EBL:  Total I/O In: 1000 [I.V.:1000] Out: 200 [Urine:75; Blood:125]  BLOOD ADMINISTERED:none  DRAINS: none   LOCAL MEDICATIONS USED:  OTHER Exparel with Bupivicaone  SPECIMEN:  Source of Specimen:  left salpingoophorectomy and right cyst   DISPOSITION OF SPECIMEN:  PATHOLOGY  COUNTS:  YES  TOURNIQUET:  * No tourniquets in log *  DICTATION: .Note written in EPIC  PLAN OF CARE: Admit to inpatient   PATIENT DISPOSITION:  PACU - hemodynamically stable.   Delay start of Pharmacological VTE agent (>24hrs) due to surgical blood loss or risk of bleeding: yes

## 2014-04-23 NOTE — Anesthesia Postprocedure Evaluation (Signed)
  Anesthesia Post-op Note  Patient: Lisa Cline  Procedure(s) Performed: Procedure(s) (LRB): LAPAROTOMY (N/A) OVARIAN CYSTECTOMY (Left) SALPINGO OOPHORECTOMY (Right)  Patient Location: PACU  Anesthesia Type: General  Level of Consciousness: awake and alert   Airway and Oxygen Therapy: Patient Spontanous Breathing  Post-op Pain: mild  Post-op Assessment: Post-op Vital signs reviewed, Patient's Cardiovascular Status Stable, Respiratory Function Stable, Patent Airway and No signs of Nausea or vomiting  Last Vitals:  Filed Vitals:   04/23/14 1723  BP: 124/85  Pulse: 104  Temp: 36.9 C  Resp: 12    Post-op Vital Signs: stable   Complications: No apparent anesthesia complications

## 2014-04-23 NOTE — Transfer of Care (Signed)
Immediate Anesthesia Transfer of Care Note  Patient: Lisa Cline  Procedure(s) Performed: Procedure(s): LAPAROTOMY (N/A) OVARIAN CYSTECTOMY (Left) SALPINGO OOPHORECTOMY (Right)  Patient Location: PACU  Anesthesia Type:General  Level of Consciousness: awake, alert  and oriented  Airway & Oxygen Therapy: Patient Spontanous Breathing and Patient connected to nasal cannula oxygen  Post-op Assessment: Report given to RN and Post -op Vital signs reviewed and stable  Post vital signs: Reviewed and stable  Last Vitals:  Filed Vitals:   04/23/14 1404  BP: 122/80  Pulse: 93  Temp: 37.1 C  Resp: 18    Complications: No apparent anesthesia complications

## 2014-04-23 NOTE — Op Note (Addendum)
04/23/2014  5:16 PM  PATIENT:  Lisa Cline  29 y.o. female  PRE-OPERATIVE DIAGNOSIS:   Bilateral ovarian dermoid cysts  POST-OPERATIVE DIAGNOSIS:   Left ovarian dermoid with torsion and infarcted tube and ovary and right dermoid cyst   PROCEDURE:  Procedure(s): LAPAROTOMY (N/A) OVARIAN CYSTECTOMY (right) SALPINGO OOPHORECTOMY (left)  SURGEON:  Surgeon(s) and Role:    * Lavonia Drafts, MD - Primary  ANESTHESIA:   general  EBL:  Total I/O In: 1000 [I.V.:1000] Out: 200 [Urine:75; Blood:125]  BLOOD ADMINISTERED:none  DRAINS: none   LOCAL MEDICATIONS USED:  OTHER Exparel with Bupivicaone  SPECIMEN:  Source of Specimen:  left salpingoophorectomy and right cyst   DISPOSITION OF SPECIMEN:  PATHOLOGY  COUNTS:  YES  TOURNIQUET:  * No tourniquets in log *  DICTATION: .Note written in EPIC  PLAN OF CARE: Admit to inpatient   PATIENT DISPOSITION:  PACU - hemodynamically stable.   Delay start of Pharmacological VTE agent (>24hrs) due to surgical blood loss or risk of bleeding: yes  INDICATIONS: The patient is a 29 y.o. G0P0000 with bilateral adnexal masses who desired definitive surgical management. On the preoperative visit, the risks, benefits, indications, and alternatives of the procedure were reviewed with the patient.  On the day of surgery, the risks of surgery were again discussed with the patient including but not limited to: bleeding which may require transfusion or reoperation; infection which may require antibiotics; injury to bowel, bladder, ureters or other surrounding organs; need for additional procedures; thromboembolic phenomenon, incisional problems and other postoperative/anesthesia complications. Written informed consent was obtained.    OPERATIVE FINDINGS: A 6 week size uterus;  left ovary with torsion and infarcted 15-16 cm ovary and fallopian tube.  The right fallopian tube was normal and the right ovary was 8 cm and contained a cyst that was  removed intact.    No other anomalies noted in pelvis, no lesions of endometriosis.     DESCRIPTION OF PROCEDURE:  The patient received intravenous antibiotics and had sequential compression devices applied to her lower extremities while in the preoperative area.   She was taken to the operating room and placed under general anesthesia without difficulty.The abdomen and perineum were prepped and draped in a sterile manner, and she was placed in a dorsal supine position.  A Foley catheter was inserted into the bladder and attached to constant drainage. After an adequate timeout was performed, a Pfannensteil skin incision was made. This incision was taken down to the fascia using electrocautery with care given to maintain good hemostasis. The fascia was incised in the midline and the fascial incision was then extended bilaterally using electrocautery without difficulty. The fascia was then dissected off the underlying rectus muscles using blunt and sharp dissection. The rectus muscles were split bluntly in the midline and the peritoneum entered sharply without complication. This peritoneal incision was then extended superiorly and inferiorly with care given to prevent bowel or bladder injury.  Attention was then turned to the pelvis. The left ovary at this point was noted to be mobilized and was delivered up out of the abdomen. The infundipulopelvic ligament was noted to be twisted multiple times.  Untwisting did not yield return of blood supply to the ovary.  The left  infundibulopelvic ligament clamped and transected and suture ligated.   This pedicle was then clamped, cut, and doubly suture ligated with 0 Vicryl. Excellent hemostasis was noted.  Attention was then directed to the right ovary.  The ovary was incised revealing  the cyst wall.  The ovary was carefully dissected off the cyst and the cyst was removed intact.  The ovary was sutured at the base of the cyst and hemostasis was achieved.  The pelvis was  irrigated and hemostasis was reconfirmed.  All laparotomy sponges and instruments were removed from the abdomen. The fascia was also closed in a running fashion with 0 vicryl suture. The subcutaneous layer was irrigated and injected with 40 ml of a mixture of 20cc of Exparel + 10cc of injectable saline + 10cc of Bupivacaine.  The skin was closed with a 4-0 Vicryl subcuticular stitch. Benzoin and steristrips were placed. Sponge, lap, needle, and instrument counts were correct times two. The patient was taken to the recovery area awake, extubated and in stable condition.  Evaluna Utke L. Ihor Dow, MD, [place] Attending [place], [facility]

## 2014-04-23 NOTE — Anesthesia Preprocedure Evaluation (Addendum)
Anesthesia Evaluation  Patient identified by MRN, date of birth, ID band Patient awake    Reviewed: Allergy & Precautions, NPO status , Patient's Chart, lab work & pertinent test results  History of Anesthesia Complications Negative for: history of anesthetic complications  Airway Mallampati: I  TM Distance: >3 FB Neck ROM: Full    Dental no notable dental hx. (+) Dental Advisory Given   Pulmonary neg pulmonary ROS,  breath sounds clear to auscultation  Pulmonary exam normal       Cardiovascular negative cardio ROS  Rhythm:Regular Rate:Normal     Neuro/Psych negative neurological ROS  negative psych ROS   GI/Hepatic negative GI ROS, Neg liver ROS,   Endo/Other  negative endocrine ROS  Renal/GU negative Renal ROS  negative genitourinary   Musculoskeletal negative musculoskeletal ROS (+)   Abdominal   Peds negative pediatric ROS (+)  Hematology negative hematology ROS (+)   Anesthesia Other Findings   Reproductive/Obstetrics negative OB ROS                            Anesthesia Physical Anesthesia Plan  ASA: I  Anesthesia Plan: General   Post-op Pain Management:    Induction: Intravenous  Airway Management Planned: Oral ETT  Additional Equipment:   Intra-op Plan:   Post-operative Plan: Extubation in OR  Informed Consent: I have reviewed the patients History and Physical, chart, labs and discussed the procedure including the risks, benefits and alternatives for the proposed anesthesia with the patient or authorized representative who has indicated his/her understanding and acceptance.   Dental advisory given  Plan Discussed with: CRNA  Anesthesia Plan Comments:        Anesthesia Quick Evaluation

## 2014-04-23 NOTE — H&P (Signed)
HPI Pt presents as ED f/u. She reports that she has new onset pelvic pain. She denies fever or chills. She reports that prior to this weekend she did not have pain. She reports that over the past several days her appetite has decreased. She had to stop working yesterday and today due to the pain.   History reviewed. No pertinent past medical history.  Past Surgical History  Procedure Laterality Date  . No past surgeries     Current Outpatient Prescriptions on File Prior to Visit  Medication Sig Dispense Refill  . CALCIUM-MAGNESIUM-ZINC PO Take 3 tablets by mouth daily.    Marland Kitchen ibuprofen (ADVIL,MOTRIN) 800 MG tablet Take 800 mg by mouth every 8 (eight) hours as needed for mild pain.    . Multiple Vitamin (MULTIVITAMIN WITH MINERALS) TABS tablet Take 1 tablet by mouth daily.    Marland Kitchen omega-3 acid ethyl esters (LOVAZA) 1 G capsule Take 2 g by mouth daily.    Marland Kitchen oxyCODONE-acetaminophen (PERCOCET/ROXICET) 5-325 MG per tablet Take 2 tablets by mouth every 4 (four) hours as needed for severe pain. 15 tablet 0  . Probiotic Product (PROBIOTIC PO) Take 1 capsule by mouth daily.    . traMADol (ULTRAM) 50 MG tablet Take 50 mg by mouth every 6 (six) hours as needed for moderate pain.     No current facility-administered medications on file prior to visit.   History   Social History  . Marital Status: Unknown    Spouse Name: N/A  . Number of Children: N/A  . Years of Education: N/A   Occupational History  . Not on file.   Social History Main Topics  . Smoking status: Never Smoker   . Smokeless tobacco: Never Used  . Alcohol Use: Yes     Comment: socially  . Drug Use: No  . Sexual Activity: Yes    Birth Control/ Protection: Condom   Other Topics Concern  . Not on file   Social History Narrative   No Known Allergies      Review of Systems     Objective:   Physical Exam  BP 125/70 mmHg  Pulse 77  Temp(Src) 98.1 F (36.7 C) (Oral)  Ht 5\' 10"  (1.778 m)  Wt 168 lb 8 oz (76.431 kg)  BMI 24.18 kg/m2  LMP 04/09/2014 Pt in NAD Lungs: CTA CV: RRR Abd: soft, ND, mass effect palpated.  GU: EGBUS: no lesions Vagina: no blood in vault Cervix: no lesion; no mucopurulent d/c; no CMT Uterus: small, mobile Adnexa: mobile masses palpated bilaterally; pt uncomfortable after exam; adnexa tender to palpation      04/16/2014 CLINICAL DATA: Left lower quadrant pain  EXAM: TRANSABDOMINAL AND TRANSVAGINAL ULTRASOUND OF PELVIS  TECHNIQUE: Both transabdominal and transvaginal ultrasound examinations of the pelvis were performed. Transabdominal technique was performed for global imaging of the pelvis including uterus, ovaries, adnexal regions, and pelvic cul-de-sac. It was necessary to proceed with endovaginal exam following the transabdominal exam to visualize the uterus and bilateral adnexa.  COMPARISON: None  FINDINGS: Uterus  Measurements: 6.7 x 2.5 x 3.4 cm. No fibroids or other mass visualized.  Endometrium  Thickness: 9 mm. No focal abnormality visualized.  Right ovary  Not discretely visualized. Shadowing echogenic/hemorrhagic lesion in the right adnexa, measuring approximately 13.9 x 7.6 x 11.9 cm. This appearance is suspicious for a dermoid.  Left ovary  Complex cystic/hemorrhagic lesion in the left adnexa, measuring 8.6 x 4.7 x 9.8 cm, with associated shadowing echogenic component (image 60). This appearance is  also suspicious for a dermoid or less likely an endometrioma. Venous and arterial Doppler flow is demonstrated within adjacent ovarian parenchyma.  Other findings  No free fluid.  IMPRESSION: 13.9 cm complex right adnexal mass, suspicious for ovarian dermoid. Ovarian tissue is not identified.  9.8 cm complex cystic/solid left adnexal mass, suspicious for ovarian dermoid versus endometrioma. Patent  spectral Doppler flow within adjacent ovarian parenchyma.  Consider CT or MR pelvis with contrast for further characterization.   04/16/2014 CLINICAL DATA: Low abdominal pain for 1 month, worsening over 3 days. Right adnexal mass suspicious for ovarian dermoid demonstrated on ultrasound today.  EXAM: CT ABDOMEN AND PELVIS WITH CONTRAST  TECHNIQUE: Multidetector CT imaging of the abdomen and pelvis was performed using the standard protocol following bolus administration of intravenous contrast.  CONTRAST: 151mL OMNIPAQUE IOHEXOL 300 MG/ML SOLN  COMPARISON: Ultrasound pelvis 04/16/2014  FINDINGS: Lung bases are clear.  The liver, spleen, gallbladder, pancreas, adrenal glands, kidneys, abdominal aorta, inferior vena cava, and retroperitoneal lymph nodes are unremarkable. Stomach, small bowel, and colon are not abnormally distended. No free air or free fluid in the abdomen.  Pelvis: There is a large anterior mass in the pelvis appearing to arise from the right adnexal region. The mass contains fat fluid level with internal debris and calcification consistent with a dermoid cyst. The lesion measures about 10.2 x 8.5 cm in transverse diameter. Additional fat and soft tissue changes demonstrated posterior to the uterus and measuring about 8.8 x 5.8 cm. This may be lobular continuation of the right adnexal lesion or could represent an additional left adnexal dermoid. No free or loculated pelvic fluid collections. The appendix is normal. No destructive bone lesions.  IMPRESSION: Large anterior and posterior pelvic mass lesions containing fat, soft tissue, internal debris, and calcification. Appearance likely represents bilateral ovarian dermoid cysts.      Assessment:    Bilateral large dermoid cysts- Reviewed with pt necessity of operative treatment as these are not functional cysts     Plan:     CA 125 today Patient desires surgical management  with laparotomy with bilateral ovarian cystectomies. Pt is aware that one or both ovaries may be sacrificed but every effort will be made to preserve both ovaries.  The risks of surgery were discussed in detail with the patient including but not limited to: bleeding which may require transfusion or reoperation; infection which may require prolonged hospitalization or re-hospitalization and antibiotic therapy; injury to bowel, bladder, ureters and major vessels or other surrounding organs; need for additional procedures including laparotomy; thromboembolic phenomenon, incisional problems and other postoperative or anesthesia complications. Patient was told that the likelihood that her condition and symptoms will be treated effectively with this surgical management was very high; the postoperative expectations were also discussed in detail. The patient also understands the alternative treatment options which were discussed in full. All questions were answered

## 2014-04-23 NOTE — Anesthesia Procedure Notes (Signed)
Procedure Name: Intubation Date/Time: 04/23/2014 3:50 PM Performed by: Flossie Dibble Pre-anesthesia Checklist: Emergency Drugs available, Timeout performed, Suction available, Patient identified and Patient being monitored Patient Re-evaluated:Patient Re-evaluated prior to inductionOxygen Delivery Method: Circle system utilized Preoxygenation: Pre-oxygenation with 100% oxygen Intubation Type: IV induction Ventilation: Mask ventilation without difficulty Laryngoscope Size: Mac and 3 Grade View: Grade I Tube type: Oral Tube size: 7.0 mm Number of attempts: 1 Airway Equipment and Method: Stylet Placement Confirmation: ETT inserted through vocal cords under direct vision,  breath sounds checked- equal and bilateral and positive ETCO2 Secured at: 21 cm Tube secured with: Tape Dental Injury: Teeth and Oropharynx as per pre-operative assessment

## 2014-04-24 ENCOUNTER — Encounter (HOSPITAL_COMMUNITY): Payer: Self-pay | Admitting: Obstetrics & Gynecology

## 2014-04-24 DIAGNOSIS — Z9889 Other specified postprocedural states: Secondary | ICD-10-CM

## 2014-04-24 DIAGNOSIS — N8353 Torsion of ovary, ovarian pedicle and fallopian tube: Secondary | ICD-10-CM

## 2014-04-24 LAB — CBC
HEMATOCRIT: 27.4 % — AB (ref 36.0–46.0)
HEMOGLOBIN: 10.1 g/dL — AB (ref 12.0–15.0)
MCH: 34.2 pg — AB (ref 26.0–34.0)
MCHC: 36.9 g/dL — ABNORMAL HIGH (ref 30.0–36.0)
MCV: 92.6 fL (ref 78.0–100.0)
Platelets: 267 10*3/uL (ref 150–400)
RBC: 2.96 MIL/uL — AB (ref 3.87–5.11)
RDW: 11.4 % — ABNORMAL LOW (ref 11.5–15.5)
WBC: 10.8 10*3/uL — AB (ref 4.0–10.5)

## 2014-04-24 NOTE — Addendum Note (Signed)
Addendum  created 04/24/14 0815 by Talbot Grumbling, CRNA   Modules edited: Notes Section   Notes Section:  File: [id number]

## 2014-04-24 NOTE — Progress Notes (Signed)
CSW acknowledges consult for "advance directive".  CSW spoke with RN. RN reported intention to clarify if the patient would like information or assistance with completing an advanced directive.  CSW provided RN with copy of an advanced directive.  RN to contact either Chaplain or CSW if the patient would like to directly speak to someone about the advanced directive.   No further CSW intervention at this time.  Contact CSW as needs arise or upon patient request.

## 2014-04-24 NOTE — Addendum Note (Signed)
Addendum  created 04/24/14 1626 by Elenore Paddy, CRNA   Modules edited: Notes Section   Notes Section:  File: [id number]

## 2014-04-24 NOTE — Anesthesia Postprocedure Evaluation (Signed)
  Anesthesia Post-op Note  Patient: Lisa Cline  Procedure(s) Performed: Procedure(s): LAPAROTOMY (N/A) OVARIAN CYSTECTOMY (Left) SALPINGO OOPHORECTOMY (Right)  Patient Location: PACU and Women's Unit  Anesthesia Type:General  Level of Consciousness: awake, alert  and oriented  Airway and Oxygen Therapy: Patient Spontanous Breathing  Post-op Pain: mild  Post-op Assessment: Post-op Vital signs reviewed  Post-op Vital Signs: Reviewed  Last Vitals:  Filed Vitals:   04/24/14 0619  BP: 128/86  Pulse: 79  Temp: 36.8 C  Resp: 16    Complications: No apparent anesthesia complications, patient does c/o left upper thigh "feels numb". Will recheck. Dr. Rudean Curt made aware.

## 2014-04-24 NOTE — Anesthesia Postprocedure Evaluation (Signed)
  Anesthesia Post-op Note  Patient: Lisa Cline  Procedure(s) Performed: Procedure(s): LAPAROTOMY (N/A) OVARIAN CYSTECTOMY (Left) SALPINGO OOPHORECTOMY (Right)  Patient Location: PACU and Women's Unit  Anesthesia Type:General  Level of Consciousness: awake, alert  and oriented  Airway and Oxygen Therapy: Patient Spontanous Breathing  Post-op Pain: mild  Post-op Assessment: Post-op Vital signs reviewed, Patient's Cardiovascular Status Stable, Respiratory Function Stable, No signs of Nausea or vomiting, Adequate PO intake and Pain level controlled  Post-op Vital Signs: Reviewed and stable  Last Vitals:  Filed Vitals:   04/24/14 1400  BP: 122/73  Pulse: 87  Temp: 37.3 C  Resp: 18    Complications: No apparent anesthesia complications Patient said numbness in thigh is resolved.

## 2014-04-25 MED ORDER — IBUPROFEN 800 MG PO TABS: 800.0000 mg | ORAL_TABLET | Freq: Three times a day (TID) | ORAL | Status: DC | PRN

## 2014-04-25 MED ORDER — DOCUSATE SODIUM 100 MG PO CAPS: 100.0000 mg | ORAL_CAPSULE | Freq: Two times a day (BID) | ORAL | Status: DC

## 2014-04-25 MED ORDER — OXYCODONE-ACETAMINOPHEN 5-325 MG PO TABS: 1.0000 | ORAL_TABLET | ORAL | Status: DC | PRN

## 2014-04-25 NOTE — Progress Notes (Signed)
Pt verbalizes understanding of d/c instructions, medications, follow up appts, when to seek medical attention and belongings policy. Pt has no questions. NO IV at time of d/c. Pt ambulated to main entrance accompanied by NT, pts SO is with her and will be driving her home. Pt has prescription and d/c instructions as well as recovering from surgery book.  Lisa Cline

## 2014-04-25 NOTE — Discharge Summary (Signed)
Physician Discharge Summary  Patient ID: Lisa Cline MRN: [id number] DOB/AGE: 1985/05/20 29 y.o.  Admit date: 04/23/2014 Discharge date: 04/25/2014  Admission Diagnoses: Pelvic pain and bilateral ovarian cysts   Discharge Diagnoses:  Active Problems:   Post-operative state   Torsion of ovary and ovarian pedicle with torsion of fallopian tube bilateral ovarian dermoids   Discharged Condition: good  Hospital Course: Pt was admitted for surgery.  Her post op course has been uncomplicated.  She was tolerating a diet on post op day#1.  She is voiding without difficulty and has passed flatus but, has not had a BM.  She has adequate pain control on po meds.  She is ambulating without difficulty and feels ready for discharge.   Consults: None  Significant Diagnostic Studies: labs: CBC  Treatments: surgery: Laparotomy with LSO and right ovarian cystectomy  Discharge Exam: Blood pressure 114/78, pulse 75, temperature 98.3 F (36.8 C), temperature source Oral, resp. rate 18, height 5\' 10"  (1.778 m), weight 162 lb (73.483 kg), last menstrual period 04/09/2014, SpO2 100 %. General appearance: alert and no distress Resp: clear to auscultation bilaterally Cardio: regular rate and rhythm, S1, S2 normal, no murmur, click, rub or gallop GI: soft, non-tender; bowel sounds normal; no masses,  no organomegaly and steristrips in place.  Incision dry. Incision/Wound:see above Ext: no edema  CBC    Component Value Date/Time   WBC 10.8* 04/24/2014 0520   RBC 2.96* 04/24/2014 0520   HGB 10.1* 04/24/2014 0520   HCT 27.4* 04/24/2014 0520   PLT 267 04/24/2014 0520   MCV 92.6 04/24/2014 0520   MCH 34.2* 04/24/2014 0520   MCHC 36.9* 04/24/2014 0520   RDW 11.4* 04/24/2014 0520      Disposition: 01-Home or Self Care  Discharge Instructions    Call MD for:  difficulty breathing, headache or visual disturbances    Complete by:  As directed      Call MD for:  extreme fatigue    Complete by:   As directed      Call MD for:  hives    Complete by:  As directed      Call MD for:  persistant dizziness or light-headedness    Complete by:  As directed      Call MD for:  persistant nausea and vomiting    Complete by:  As directed      Call MD for:  redness, tenderness, or signs of infection (pain, swelling, redness, odor or green/yellow discharge around incision site)    Complete by:  As directed      Call MD for:  severe uncontrolled pain    Complete by:  As directed      Call MD for:  temperature >100.4    Complete by:  As directed      Diet general    Complete by:  As directed      Driving Restrictions    Complete by:  As directed   No driving for 2 weeks     Increase activity slowly    Complete by:  As directed      Leave dressing on - Keep it clean, dry, and intact until clinic visit    Complete by:  As directed      Lifting restrictions    Complete by:  As directed   No lifting >20# for 4 weeks     Sexual Activity Restrictions    Complete by:  As directed   No sexual relations for 6  weeks            Medication List    STOP taking these medications        traMADol 50 MG tablet  Commonly known as:  ULTRAM      TAKE these medications        CALCIUM-MAGNESIUM-ZINC PO  Take 3 tablets by mouth daily.     docusate sodium 100 MG capsule  Commonly known as:  COLACE  Take 1 capsule (100 mg total) by mouth 2 (two) times daily.     ibuprofen 800 MG tablet  Commonly known as:  ADVIL,MOTRIN  Take 1 tablet (800 mg total) by mouth every 8 (eight) hours as needed (mild pain).     multivitamin with minerals Tabs tablet  Take 1 tablet by mouth daily.     omega-3 acid ethyl esters 1 G capsule  Commonly known as:  LOVAZA  Take 2 g by mouth daily.     oxyCODONE-acetaminophen 5-325 MG per tablet  Commonly known as:  PERCOCET/ROXICET  Take 1-2 tablets by mouth every 4 (four) hours as needed (moderate to severe pain (when tolerating fluids)).     PROBIOTIC PO  Take  1 capsule by mouth daily.           Follow-up Information    Follow up with Lavonia Drafts, MD In 2 weeks.   Specialty:  Obstetrics and Gynecology   Contact information:   [place] [id number] [phone]       Signed: Lavonia Drafts 04/25/2014, 10:15 AM

## 2014-04-25 NOTE — Discharge Instructions (Signed)
Laparotomy °Care After °Refer to this sheet in the next few weeks. These instructions provide you with information on caring for yourself after your procedure. Your caregiver may also give you more specific instructions. Your treatment has been planned according to current medical practices, but problems sometimes occur. Call your caregiver if you have any problems or questions after your procedure. °HOME CARE INSTRUCTIONS °ACTIVITY °· Rest as much as possible the first two weeks at home. °· Avoid strenuous activity such as heavy lifting (more than 10 pounds), pushing, or pulling. Limit stair climbing to once or twice a day for the first week, then slowly increase this activity. °· Take frequent rest periods throughout the day. °· Talk with your caregiver about when you may resume your usual physical activity. °· You need to be out of bed and walking as much as possible. This decreases the chance of: °¨ Blood clots. °¨ Pneumonia. °NUTRITION °· You can resume your normal diet once you regain bowel function. °· Drink plenty of fluids (6-8 glasses a day or as instructed by your caregiver). °· Eat a well-balanced diet. °· Daily portions of food from the meat (protein), milk, vegetable, and bread groups are necessary for your health. °ELIMINATION °It is very important not to strain during bowel movements. If constipation should occur, you may: °· Take a mild laxative. °· Add fruit and bran to your diet. °· Drink more fluids. °HYGIENE °· Take showers, not baths, until 4-6 weeks after surgery. °· If your incision is closed, you may take a shower or tub bath. °FEVER °If you feel feverish or have shaking chills, take your temperature. If it is 102° F (38.9° C), call your caregiver. The fever may mean there is an infection. °PAIN CONTROL °· Mild discomfort may occur. °· Only take over-the-counter or prescription medicines for pain, discomfort, or fever as directed by your caregiver. Take any prescribed medicines exactly as  directed. °INCISION CARE °· Keep your incision site clean with soap and water. °· Do not use a dressing unless your cut (incision) from surgery is draining or irritated. °· If you have small adhesive strips in place and they do not fall off within 10 days, carefully peel them off. °· Check your incision and surrounding area daily for any redness, swelling, discoloration, heavy drainage, or separation of the skin. °SEXUAL INTERCOURSE °Do not have sexual intercourse until after your follow-up appointment, unless your caregiver tells you otherwise. °SEEK MEDICAL CARE IF:  °· You are unable to tolerate food or drinks. °· You are unable to pass gas or have a bowel movement. °· Your pain becomes more severe or is not relieved with medicines. °· You have redness, swelling, discoloration, heavy drainage, or separation of the skin at the incision site. °Document Released: 09/23/2003 Document Revised: 01/26/2012 Document Reviewed: 02/07/2007 °ExitCare® Patient Information ©2015 [employer]. This information is not intended to replace advice given to you by your health care provider. Make sure you discuss any questions you have with your health care provider. ° °

## 2014-05-10 ENCOUNTER — Encounter: Payer: Self-pay | Admitting: Obstetrics & Gynecology

## 2014-05-10 ENCOUNTER — Ambulatory Visit (INDEPENDENT_AMBULATORY_CARE_PROVIDER_SITE_OTHER): Payer: Managed Care, Other (non HMO) | Admitting: Obstetrics & Gynecology

## 2014-05-10 VITALS — BP 129/74 | HR 96 | Temp 98.4°F | Wt 161.8 lb

## 2014-05-10 DIAGNOSIS — R234 Changes in skin texture: Secondary | ICD-10-CM

## 2014-05-10 DIAGNOSIS — Z9889 Other specified postprocedural states: Secondary | ICD-10-CM

## 2014-05-10 LAB — CBC
HEMATOCRIT: 30.7 % — AB (ref 36.0–46.0)
Hemoglobin: 10.8 g/dL — ABNORMAL LOW (ref 12.0–15.0)
MCH: 33.8 pg (ref 26.0–34.0)
MCHC: 35.2 g/dL (ref 30.0–36.0)
MCV: 95.9 fL (ref 78.0–100.0)
MPV: 9 fL (ref 8.6–12.4)
Platelets: 356 10*3/uL (ref 150–400)
RBC: 3.2 MIL/uL — ABNORMAL LOW (ref 3.87–5.11)
RDW: 13 % (ref 11.5–15.5)
WBC: 8 10*3/uL (ref 4.0–10.5)

## 2014-05-10 MED ORDER — FLUCONAZOLE 150 MG PO TABS: 150.0000 mg | ORAL_TABLET | Freq: Once | ORAL | Status: DC

## 2014-05-10 MED ORDER — SULFAMETHOXAZOLE-TRIMETHOPRIM 800-160 MG PO TABS: 1.0000 | ORAL_TABLET | Freq: Two times a day (BID) | ORAL | Status: DC

## 2014-05-10 NOTE — Progress Notes (Signed)
Subjective:     Patient ID: Lisa Cline, female   DOB: Feb 10, 1986, 29 y.o.   MRN: [id number]  HPI Pt reports that she was feeling great and then 2 days ago she started having increased pain in her incision. She denies fever or chills.  She reprots that she did increase her activities somewhat and may have 'overdone' it.  No N/V    Review of Systems     Objective:   Physical Exam BP 129/74 mmHg  Pulse 96  Temp(Src) 98.4 F (36.9 C) (Oral)  Wt 161 lb 12.8 oz (73.392 kg)  LMP 05/04/2014 Pt in NAD Abd: soft, NT, ND Incision: indurated.  Not warm. NOT fluctuant.  Well healed and closed. Area marked out    Assessment:     Superficial wound infection     Plan:     Bactrim DS 1 po bid x 14 days Pt to f/u sooner prn drainage or fever or chills. CBC today Diflucan 150mg  po prn yeast infxn Pt may restart her pain meds as needed

## 2014-05-10 NOTE — Patient Instructions (Signed)
Wound Infection °A wound infection happens when a type of germ (bacteria) starts growing in the wound. In some cases, this can cause the wound to break open. If cared for properly, the infected wound will heal from the inside to the outside. Wound infections need treatment. °CAUSES °An infection is caused by bacteria growing in the wound.  °SYMPTOMS  °· Increase in redness, swelling, or pain at the wound site. °· Increase in drainage at the wound site. °· Wound or bandage (dressing) starts to smell bad. °· Fever. °· Feeling tired or fatigued. °· Pus draining from the wound. °TREATMENT  °Your health care provider will prescribe antibiotic medicine. The wound infection should improve within 24 to 48 hours. Any redness around the wound should stop spreading and the wound should be less painful.  °HOME CARE INSTRUCTIONS  °· Only take over-the-counter or prescription medicines for pain, discomfort, or fever as directed by your health care provider. °· Take your antibiotics as directed. Finish them even if you start to feel better. °· Gently wash the area with mild soap and water 2 times a day, or as directed. Rinse off the soap. Pat the area dry with a clean towel. Do not rub the wound. This may cause bleeding. °· Follow your health care provider's instructions for how often you need to change the dressing. °· Apply ointment and a dressing to the wound as directed. °· If the dressing sticks, moisten it with soapy water and gently remove it. °· Change the bandage right away if it becomes wet, dirty, or develops a bad smell. °· Take showers. Do not take tub baths, swim, or do anything that may soak the wound until it is healed. °· Avoid exercises that make you sweat heavily. °· Use anti-itch medicine as directed by your health care provider. The wound may itch when it is healing. Do not pick or scratch at the wound. °· Follow up with your health care provider to get your wound rechecked as directed. °SEEK MEDICAL CARE  IF: °· You have an increase in swelling, pain, or redness around the wound. °· You have an increase in the amount of pus coming from the wound. °· There is a bad smell coming from the wound. °· More of the wound breaks open. °· You have a fever. °MAKE SURE YOU:  °· Understand these instructions. °· Will watch your condition. °· Will get help right away if you are not doing well or get worse. °Document Released: 11/07/2002 Document Revised: 02/13/2013 Document Reviewed: 06/14/2010 °ExitCare® Patient Information ©2015 [employer]. This information is not intended to replace advice given to you by your health care provider. Make sure you discuss any questions you have with your health care provider. ° °

## 2014-05-13 ENCOUNTER — Encounter: Payer: Self-pay | Admitting: Obstetrics & Gynecology

## 2014-05-13 ENCOUNTER — Ambulatory Visit (INDEPENDENT_AMBULATORY_CARE_PROVIDER_SITE_OTHER): Payer: Managed Care, Other (non HMO) | Admitting: Obstetrics & Gynecology

## 2014-05-13 VITALS — BP 140/84 | HR 87 | Temp 98.5°F | Wt 161.1 lb

## 2014-05-13 DIAGNOSIS — Z9889 Other specified postprocedural states: Secondary | ICD-10-CM

## 2014-05-13 DIAGNOSIS — R234 Changes in skin texture: Secondary | ICD-10-CM

## 2014-05-13 MED ORDER — SULFAMETHOXAZOLE-TRIMETHOPRIM 800-160 MG PO TABS: 1.0000 | ORAL_TABLET | Freq: Two times a day (BID) | ORAL | Status: DC

## 2014-05-13 NOTE — Patient Instructions (Signed)
Wound Infection °A wound infection happens when a type of germ (bacteria) starts growing in the wound. In some cases, this can cause the wound to break open. If cared for properly, the infected wound will heal from the inside to the outside. Wound infections need treatment. °CAUSES °An infection is caused by bacteria growing in the wound.  °SYMPTOMS  °· Increase in redness, swelling, or pain at the wound site. °· Increase in drainage at the wound site. °· Wound or bandage (dressing) starts to smell bad. °· Fever. °· Feeling tired or fatigued. °· Pus draining from the wound. °TREATMENT  °Your health care provider will prescribe antibiotic medicine. The wound infection should improve within 24 to 48 hours. Any redness around the wound should stop spreading and the wound should be less painful.  °HOME CARE INSTRUCTIONS  °· Only take over-the-counter or prescription medicines for pain, discomfort, or fever as directed by your health care provider. °· Take your antibiotics as directed. Finish them even if you start to feel better. °· Gently wash the area with mild soap and water 2 times a day, or as directed. Rinse off the soap. Pat the area dry with a clean towel. Do not rub the wound. This may cause bleeding. °· Follow your health care provider's instructions for how often you need to change the dressing. °· Apply ointment and a dressing to the wound as directed. °· If the dressing sticks, moisten it with soapy water and gently remove it. °· Change the bandage right away if it becomes wet, dirty, or develops a bad smell. °· Take showers. Do not take tub baths, swim, or do anything that may soak the wound until it is healed. °· Avoid exercises that make you sweat heavily. °· Use anti-itch medicine as directed by your health care provider. The wound may itch when it is healing. Do not pick or scratch at the wound. °· Follow up with your health care provider to get your wound rechecked as directed. °SEEK MEDICAL CARE  IF: °· You have an increase in swelling, pain, or redness around the wound. °· You have an increase in the amount of pus coming from the wound. °· There is a bad smell coming from the wound. °· More of the wound breaks open. °· You have a fever. °MAKE SURE YOU:  °· Understand these instructions. °· Will watch your condition. °· Will get help right away if you are not doing well or get worse. °Document Released: 11/07/2002 Document Revised: 02/13/2013 Document Reviewed: 06/14/2010 °ExitCare® Patient Information ©2015 [employer]. This information is not intended to replace advice given to you by your health care provider. Make sure you discuss any questions you have with your health care provider. ° °

## 2014-05-13 NOTE — Progress Notes (Signed)
Subjective:     Patient ID: Lisa Cline, female   DOB: 05/22/1985, 29 y.o.   MRN: [id number]  HPI Pt presents to have a f/u visit for a wound infection.  She reports less pain today than last week.  She denies fever or chills.        Review of Systems     Objective:   Physical Exam BP 140/84 mmHg  Pulse 87  Temp(Src) 98.5 F (36.9 C)  Wt 161 lb 1.6 oz (73.074 kg)  LMP 05/04/2014 Pt in NAD  Abd: incision clean and dry.  Induration is below marked edges.  No fluctuance or warmth noted     Assessment:     Superficial wound infection- improved from last week    Plan:     Bactrim cont for 10 days F/u in 1 week or sooner prn

## 2014-05-15 ENCOUNTER — Ambulatory Visit: Payer: Managed Care, Other (non HMO) | Admitting: Obstetrics & Gynecology

## 2014-05-20 ENCOUNTER — Ambulatory Visit: Payer: Managed Care, Other (non HMO) | Admitting: Obstetrics & Gynecology

## 2014-05-20 ENCOUNTER — Encounter: Payer: Self-pay | Admitting: Obstetrics & Gynecology

## 2014-05-20 ENCOUNTER — Ambulatory Visit (INDEPENDENT_AMBULATORY_CARE_PROVIDER_SITE_OTHER): Payer: Managed Care, Other (non HMO) | Admitting: Obstetrics & Gynecology

## 2014-05-20 VITALS — BP 133/77 | HR 87 | Temp 98.5°F | Ht 70.0 in | Wt 157.1 lb

## 2014-05-20 DIAGNOSIS — R234 Changes in skin texture: Secondary | ICD-10-CM

## 2014-05-20 DIAGNOSIS — Z9889 Other specified postprocedural states: Secondary | ICD-10-CM

## 2014-05-20 MED ORDER — FLUCONAZOLE 150 MG PO TABS: 150.0000 mg | ORAL_TABLET | Freq: Once | ORAL | Status: DC

## 2014-05-20 NOTE — Progress Notes (Signed)
Subjective:     Patient ID: Lisa Cline, female   DOB: Jan 01, 1986, 29 y.o.   MRN: [id number]  HPI Pt reports that her pain is improved.  She denies F/C or drainage from the incision.   Review of Systems     Objective:   Physical Exam BP 133/77 mmHg  Pulse 87  Temp(Src) 98.5 F (36.9 C) (Oral)  Ht 5\' 10"  (1.778 m)  Wt 157 lb 1.6 oz (71.26 kg)  BMI 22.54 kg/m2  LMP 05/04/2014 Pt in NAD Abd: incision clean, dry and intact.  Area of induration markedly decreased.  No fluctuance.        Assessment:     4 week post op check     Plan:     RTW  No lifting >20# for 2 weeks F/u in 3-6 months or sooner prn  Gradual return to full activities

## 2014-05-20 NOTE — Patient Instructions (Signed)
Laparotomy °Care After °Refer to this sheet in the next few weeks. These instructions provide you with information on caring for yourself after your procedure. Your caregiver may also give you more specific instructions. Your treatment has been planned according to current medical practices, but problems sometimes occur. Call your caregiver if you have any problems or questions after your procedure. °HOME CARE INSTRUCTIONS °ACTIVITY °· Rest as much as possible the first two weeks at home. °· Avoid strenuous activity such as heavy lifting (more than 10 pounds), pushing, or pulling. Limit stair climbing to once or twice a day for the first week, then slowly increase this activity. °· Take frequent rest periods throughout the day. °· Talk with your caregiver about when you may resume your usual physical activity. °· You need to be out of bed and walking as much as possible. This decreases the chance of: °¨ Blood clots. °¨ Pneumonia. °NUTRITION °· You can resume your normal diet once you regain bowel function. °· Drink plenty of fluids (6-8 glasses a day or as instructed by your caregiver). °· Eat a well-balanced diet. °· Daily portions of food from the meat (protein), milk, vegetable, and bread groups are necessary for your health. °ELIMINATION °It is very important not to strain during bowel movements. If constipation should occur, you may: °· Take a mild laxative. °· Add fruit and bran to your diet. °· Drink more fluids. °HYGIENE °· Take showers, not baths, until 4-6 weeks after surgery. °· If your incision is closed, you may take a shower or tub bath. °FEVER °If you feel feverish or have shaking chills, take your temperature. If it is 102° F (38.9° C), call your caregiver. The fever may mean there is an infection. °PAIN CONTROL °· Mild discomfort may occur. °· Only take over-the-counter or prescription medicines for pain, discomfort, or fever as directed by your caregiver. Take any prescribed medicines exactly as  directed. °INCISION CARE °· Keep your incision site clean with soap and water. °· Do not use a dressing unless your cut (incision) from surgery is draining or irritated. °· If you have small adhesive strips in place and they do not fall off within 10 days, carefully peel them off. °· Check your incision and surrounding area daily for any redness, swelling, discoloration, heavy drainage, or separation of the skin. °SEXUAL INTERCOURSE °Do not have sexual intercourse until after your follow-up appointment, unless your caregiver tells you otherwise. °SEEK MEDICAL CARE IF:  °· You are unable to tolerate food or drinks. °· You are unable to pass gas or have a bowel movement. °· Your pain becomes more severe or is not relieved with medicines. °· You have redness, swelling, discoloration, heavy drainage, or separation of the skin at the incision site. °Document Released: 09/23/2003 Document Revised: 01/26/2012 Document Reviewed: 02/07/2007 °ExitCare® Patient Information ©2015 [employer]. This information is not intended to replace advice given to you by your health care provider. Make sure you discuss any questions you have with your health care provider. ° °

## 2014-06-08 ENCOUNTER — Other Ambulatory Visit: Payer: Self-pay | Admitting: Obstetrics & Gynecology

## 2014-06-08 DIAGNOSIS — R234 Changes in skin texture: Secondary | ICD-10-CM

## 2014-06-10 MED ORDER — FLUCONAZOLE 150 MG PO TABS: 150.0000 mg | ORAL_TABLET | Freq: Once | ORAL | Status: DC

## 2014-08-19 ENCOUNTER — Encounter: Payer: Self-pay | Admitting: Obstetrics & Gynecology

## 2014-08-19 ENCOUNTER — Ambulatory Visit (INDEPENDENT_AMBULATORY_CARE_PROVIDER_SITE_OTHER): Payer: Managed Care, Other (non HMO) | Admitting: Obstetrics & Gynecology

## 2014-08-19 VITALS — BP 125/80 | HR 76 | Temp 98.3°F | Wt 167.7 lb

## 2014-08-19 DIAGNOSIS — Z872 Personal history of diseases of the skin and subcutaneous tissue: Secondary | ICD-10-CM | POA: Diagnosis not present

## 2014-08-19 NOTE — Patient Instructions (Signed)
Laparotomy °Care After °Refer to this sheet in the next few weeks. These instructions provide you with information on caring for yourself after your procedure. Your caregiver may also give you more specific instructions. Your treatment has been planned according to current medical practices, but problems sometimes occur. Call your caregiver if you have any problems or questions after your procedure. °HOME CARE INSTRUCTIONS °ACTIVITY °· Rest as much as possible the first two weeks at home. °· Avoid strenuous activity such as heavy lifting (more than 10 pounds), pushing, or pulling. Limit stair climbing to once or twice a day for the first week, then slowly increase this activity. °· Take frequent rest periods throughout the day. °· Talk with your caregiver about when you may resume your usual physical activity. °· You need to be out of bed and walking as much as possible. This decreases the chance of: °¨ Blood clots. °¨ Pneumonia. °NUTRITION °· You can resume your normal diet once you regain bowel function. °· Drink plenty of fluids (6-8 glasses a day or as instructed by your caregiver). °· Eat a well-balanced diet. °· Daily portions of food from the meat (protein), milk, vegetable, and bread groups are necessary for your health. °ELIMINATION °It is very important not to strain during bowel movements. If constipation should occur, you may: °· Take a mild laxative. °· Add fruit and bran to your diet. °· Drink more fluids. °HYGIENE °· Take showers, not baths, until 4-6 weeks after surgery. °· If your incision is closed, you may take a shower or tub bath. °FEVER °If you feel feverish or have shaking chills, take your temperature. If it is 102° F (38.9° C), call your caregiver. The fever may mean there is an infection. °PAIN CONTROL °· Mild discomfort may occur. °· Only take over-the-counter or prescription medicines for pain, discomfort, or fever as directed by your caregiver. Take any prescribed medicines exactly as  directed. °INCISION CARE °· Keep your incision site clean with soap and water. °· Do not use a dressing unless your cut (incision) from surgery is draining or irritated. °· If you have small adhesive strips in place and they do not fall off within 10 days, carefully peel them off. °· Check your incision and surrounding area daily for any redness, swelling, discoloration, heavy drainage, or separation of the skin. °SEXUAL INTERCOURSE °Do not have sexual intercourse until after your follow-up appointment, unless your caregiver tells you otherwise. °SEEK MEDICAL CARE IF:  °· You are unable to tolerate food or drinks. °· You are unable to pass gas or have a bowel movement. °· Your pain becomes more severe or is not relieved with medicines. °· You have redness, swelling, discoloration, heavy drainage, or separation of the skin at the incision site. °Document Released: 09/23/2003 Document Revised: 01/26/2012 Document Reviewed: 02/07/2007 °ExitCare® Patient Information ©2015 [employer]. This information is not intended to replace advice given to you by your health care provider. Make sure you discuss any questions you have with your health care provider. ° °

## 2014-08-19 NOTE — Progress Notes (Signed)
Patient ID: Lisa Cline, female   DOB: July 29, 1985, 29 y.o.   MRN: [id number] History:  29 y.o. G0P0000 here today for 3 month post op check and eval of incision. Pt reports that she has gained weight and now her incision is no longer flat.  She reports that she gained some weight after her surgery.   The following portions of the patient's history were reviewed and updated as appropriate: allergies, current medications, past family history, past medical history, past social history, past surgical history and problem list.  Review of Systems:  Pertinent items are noted in HPI.  Objective:  Physical Exam Blood pressure 125/80, pulse 76, temperature 98.3 F (36.8 C), temperature source Oral, weight 167 lb 11.2 oz (76.068 kg), last menstrual period 08/14/2014. Gen: NAD Abd: Soft, nontender and nondistended; small keloid across incision.     Assessment & Plan:  F/u in 1 year for annual or sooner prn Reviewed with pt her sono and CT results from prior to surgery 70% of 18min visit spent on face to face discussion and review of radiology reports

## 2017-07-20 ENCOUNTER — Encounter: Payer: Self-pay | Admitting: Obstetrics & Gynecology

## 2017-07-20 ENCOUNTER — Ambulatory Visit (INDEPENDENT_AMBULATORY_CARE_PROVIDER_SITE_OTHER): Payer: 59 | Admitting: Obstetrics & Gynecology

## 2017-07-20 ENCOUNTER — Inpatient Hospital Stay (HOSPITAL_COMMUNITY): Admission: RE | Admit: 2017-07-20 | Payer: Self-pay | Source: Ambulatory Visit

## 2017-07-20 VITALS — BP 124/82 | HR 73 | Ht 70.0 in | Wt 219.4 lb

## 2017-07-20 DIAGNOSIS — Z113 Encounter for screening for infections with a predominantly sexual mode of transmission: Secondary | ICD-10-CM

## 2017-07-20 DIAGNOSIS — N9489 Other specified conditions associated with female genital organs and menstrual cycle: Secondary | ICD-10-CM

## 2017-07-20 DIAGNOSIS — Z124 Encounter for screening for malignant neoplasm of cervix: Secondary | ICD-10-CM

## 2017-07-20 DIAGNOSIS — Z1151 Encounter for screening for human papillomavirus (HPV): Secondary | ICD-10-CM

## 2017-07-20 DIAGNOSIS — Z87898 Personal history of other specified conditions: Secondary | ICD-10-CM

## 2017-07-20 DIAGNOSIS — Z01419 Encounter for gynecological examination (general) (routine) without abnormal findings: Secondary | ICD-10-CM | POA: Diagnosis not present

## 2017-07-20 DIAGNOSIS — N949 Unspecified condition associated with female genital organs and menstrual cycle: Secondary | ICD-10-CM

## 2017-07-20 NOTE — Progress Notes (Signed)
History:  32 y.o. G0P0000 here today for eval of pelvic mass. Pt was seen in the ED with c/o pelvic pain.  She reports that she has actually been having pain and increased abd girth for several months but she did not think it was significant and she was bust she she did not have an eval. She denies N/V or weight loss.  She denies recent PAP. She denies constitutional sx.      The following portions of the patient's history were reviewed and updated as appropriate: allergies, current medications, past family history, past medical history, past social history, past surgical history and problem list.  Review of Systems:  Pertinent items are noted in HPI.    Objective:  Physical Exam Blood pressure 124/82, pulse 73, height 5\' 10"  (1.778 m), weight 219 lb 6.4 oz (99.5 kg), last menstrual period 07/03/2017.  CONSTITUTIONAL: Well-developed, well-nourished female in no acute distress.  HENT:  Normocephalic, atraumatic EYES: Conjunctivae and EOM are normal. No scleral icterus.  NECK: Normal range of motion SKIN: Skin is warm and dry. No rash noted. Not diaphoretic.No pallor. [place]: Alert and oriented to person, place, and time. Normal coordination.  Lungs: CTA CV: RRR Abd: Soft, nontender and nondistended. Large mass effect taking up the entire abd cavity. This does not appear to be mobile but, may be limited by space.  Pelvic: Normal appearing external genitalia; normal appearing vaginal mucosa and cervix.  Normal discharge.  The uterus appears to be enlarged.   The exam is limited by the mass effect.   Labs and Imaging 07/15/2017 INDICATION: Abdominal pain, unspecified Mass, lump, or adenopathy, pelvis. COMPARISON:None. TECHNIQUE:CT ABDOMEN PELVIS W IV CONTRAST - 100 mL Isovue 370 were administered intravenouslyRadiation dose reduction was utilized (automated exposure control, mA or kV adjustment based on patient size, or iterative image reconstruction). I-STAT  creatinine 0.7 and  GFR 134.  FINDINGS:  CT ABDOMEN: Lung bases clear. Heart size normal. Normal liver, gallbladder, biliary tree, pancreas, spleen, and adrenal glands. Mild bilateral hydronephrosis. No retroperitoneal adenopathy or ascites.  CT pelvis: There is a giant abdominopelvic mass measuring 22 x 25 x 13 cm that is predominantly solid and may arise from the left ovary and extends from the pelvis into the abdomen above the umbilicus. The anterior uterus is lobular suggesting a fibroid.  There is a apparent right ovarian dermoid containing cyst, fat, and calcification and measuring 10 x 7 cm axial image 128. There is no pelvic free fluid or definite adenopathy. Colon is compressed and there is proximal fecal retention. No evidence for  small bowel obstruction, diverticulitis, or appendicitis.  Other Result Information  Acute Interface, Incoming Rad Results - 07/15/2017 12:54 PM EDT INDICATION: Abdominal pain, unspecified Mass, lump, or adenopathy, pelvis. COMPARISON:  None.   TECHNIQUE:  CT ABDOMEN PELVIS W IV CONTRAST - 100 mL Isovue 370 were administered intravenously  Radiation dose reduction was utilized (automated exposure control, mA or kV adjustment based on patient size, or iterative image reconstruction). I-STAT  creatinine 0.7 and GFR 134.  FINDINGS:  CT ABDOMEN: Lung bases clear. Heart size normal. Normal liver, gallbladder, biliary tree, pancreas, spleen, and adrenal glands. Mild bilateral hydronephrosis. No retroperitoneal adenopathy or ascites.  CT pelvis: There is a giant abdominopelvic mass measuring 22 x 25 x 13 cm that is predominantly solid and may arise from the left ovary and extends from the pelvis into the abdomen above the umbilicus. The anterior uterus is lobular suggesting a fibroid.  There is a apparent right  ovarian dermoid containing cyst, fat, and calcification and measuring 10 x 7 cm axial image 128. There is no pelvic free fluid or definite adenopathy. Colon is compressed and  there is proximal fecal retention. No evidence for  small bowel obstruction, diverticulitis, or appendicitis.   IMPRESSION: 1. Giant abdominopelvic solid mass measuring 22 x 25 x 13 cm possibly arising from the left ovary 2. 10 x 7 cm right ovarian dermoid 3. Anterior uterus is lobular suggesting a fibroid 4. Compression of the ureters and colon causing mild bilateral hydronephrosis and proximal fecal retention.     Assessment & Plan:  Large pelvic mass  Labs: CA125, CEA, OVA1 screen  Pelvic MRI  F/u PAP  Referral to G[facility].   Discussed with pt the need for surgical resection.  She was referred and wants me to do the surgery but, given the ureteral involvement I believe that her care will best be managed by GYN [facility] even if her case is benign.  Note sent to [facility] for appt.  Ashur Glatfelter L. Harraway-Smith, M.D., [name]

## 2017-07-21 ENCOUNTER — Encounter: Payer: Self-pay | Admitting: Obstetrics & Gynecology

## 2017-07-21 LAB — CEA: CEA: 1.4 ng/mL (ref 0.0–4.7)

## 2017-07-21 LAB — CYTOLOGY - PAP
Chlamydia: NEGATIVE
DIAGNOSIS: NEGATIVE
HPV: NOT DETECTED
Neisseria Gonorrhea: NEGATIVE

## 2017-07-21 LAB — PREMENOPAUSAL INTERP: LOW

## 2017-07-21 LAB — OVARIAN MALIGNANCY RISK-ROMA
Cancer Antigen (CA) 125: 32.3 U/mL (ref 0.0–38.1)
HE4: 37 pmol/L (ref 0.0–61.2)
POSTMENOPAUSAL ROMA: 1.43
Premenopausal ROMA: 0.4

## 2017-07-21 LAB — POSTMENOPAUSAL INTERP: LOW

## 2017-07-22 ENCOUNTER — Telehealth: Payer: Self-pay | Admitting: Obstetrics & Gynecology

## 2017-07-22 ENCOUNTER — Telehealth: Payer: Self-pay

## 2017-07-22 ENCOUNTER — Other Ambulatory Visit: Payer: Self-pay | Admitting: Obstetrics & Gynecology

## 2017-07-22 ENCOUNTER — Telehealth: Payer: Self-pay | Admitting: *Deleted

## 2017-07-22 DIAGNOSIS — N949 Unspecified condition associated with female genital organs and menstrual cycle: Secondary | ICD-10-CM

## 2017-07-22 DIAGNOSIS — Z87898 Personal history of other specified conditions: Secondary | ICD-10-CM

## 2017-07-22 DIAGNOSIS — N9489 Other specified conditions associated with female genital organs and menstrual cycle: Secondary | ICD-10-CM

## 2017-07-22 NOTE — Telephone Encounter (Signed)
TC to pt. I have  [place] to set an appt.  I spoke to Dr. Denman George and we will hopld off on the MRI until she has seen her.   Pt is happy and already called requesting to see GYN [facility]. I am pleased that we have the same thoughts.  I reviewed her tumor markers. All questions answered.   Demetruis Depaul L. Harraway-Smith, M.D., [name]

## 2017-07-22 NOTE — Telephone Encounter (Signed)
Patient called and made aware of appointment with Dr. Denman George on June 5 at 10 am. Patient made aware that she need to arrive by 9:45 am and check in at registation at [facility]. Kathrene Alu RN

## 2017-07-22 NOTE — Telephone Encounter (Signed)
Returned called to Dr. Vladimir Creeks office to schedule an appointment for this patient with Dr. Denman George. Returned called to Silvio Clayman was not available, another office staff member by the name of Elta Guadeloupe took the appointment information.  Appointment scheduled for June 5th, 2019 at 10a.m. , requested the patient to arrive at our office at 9:45am to get registered, and that a pelvic would be done.  Office representative verbalized understanding.  Dr. Vladimir Creeks office has agreed to notify the patient and give her the appointment information.

## 2017-07-23 ENCOUNTER — Ambulatory Visit (HOSPITAL_BASED_OUTPATIENT_CLINIC_OR_DEPARTMENT_OTHER): Payer: 59

## 2017-07-25 ENCOUNTER — Other Ambulatory Visit: Payer: 59

## 2017-07-26 ENCOUNTER — Encounter: Payer: Self-pay | Admitting: *Deleted

## 2017-07-27 ENCOUNTER — Telehealth: Payer: Self-pay | Admitting: *Deleted

## 2017-07-27 ENCOUNTER — Encounter: Payer: Self-pay | Admitting: Gynecologic Oncology

## 2017-07-27 ENCOUNTER — Inpatient Hospital Stay: Payer: 59 | Attending: Gynecologic Oncology | Admitting: Gynecologic Oncology

## 2017-07-27 ENCOUNTER — Encounter: Payer: Self-pay | Admitting: Oncology

## 2017-07-27 VITALS — BP 142/85 | HR 82 | Temp 98.2°F | Resp 20 | Ht 70.0 in | Wt 221.1 lb

## 2017-07-27 DIAGNOSIS — N83201 Unspecified ovarian cyst, right side: Secondary | ICD-10-CM

## 2017-07-27 DIAGNOSIS — Z90721 Acquired absence of ovaries, unilateral: Secondary | ICD-10-CM

## 2017-07-27 DIAGNOSIS — R1900 Intra-abdominal and pelvic swelling, mass and lump, unspecified site: Secondary | ICD-10-CM | POA: Diagnosis present

## 2017-07-27 DIAGNOSIS — N949 Unspecified condition associated with female genital organs and menstrual cycle: Secondary | ICD-10-CM

## 2017-07-27 NOTE — H&P (View-Only) (Signed)
Consult Note: Gyn-Onc  Consult was requested by Dr. Ihor Dow for the evaluation of Lisa Cline 32 y.o. female  CC:  Chief Complaint  Patient presents with  . Pelvic mass    Assessment/Plan:  Ms. PRESLIE DEPASQUALE  is a 32 y.o.  year old with a large abdominopelvic mass likely emanating from the uterus versus left adnexa. She has a history of an LSO, therefore it is more likely that this is a fibroid, however, it was not present in 2016 and therefore has grown to a large size over 3 years.  I am recommending an MRI to better delineate the origins of the mass and its attachments.  We will also obtain tumor markers for solid ovarian tumor such as germ cells and stromal tumors.  I am recommending surgical excision with exploratory laparotomy resection of pelvic mass with possible myomectomy, and right ovarian cystectomy of the apparent right ovarian dermoid cyst.  I explained that abdominal myomectomy of a mass this large is associated with a high risk of intraoperative or postoperative hemorrhage.  We will type and cross the patient preoperatively.  We will have a Cell Saver available to reduce the likelihood of needing transfusion.  We will also use vasopressin intraoperatively injected into the uterus to decrease intraoperative blood loss.  I discussed that myomectomy is associated with increased risk for postoperative fever.  It is associated with an increased risk for postoperative bowel adhesions to the uterus.  It is also associated with increased risk for pregnancy complications including uterine rupture and may require cesarean section delivery in a future pregnancy.  The right ovarian cyst appears most consistent with a dermoid on the written report.  We will better evaluate this with the MRI.  I explained that cystectomy is associated with an increased risk for recurrence of the cyst, however, given that she is premenopausal and age, and has had a prior left  salpingo-nephrectomy, it is most appropriate to preserve whatever ovarian tissue as possible to prevent surgical sterilization.  In doing so she stands an increased risk for requiring reoperation in the future for recurrent cyst.  With her family present I discussed surgical risks as listed above in addition to  bleeding, infection, damage to internal organs (such as bladder,ureters, bowels), blood clot, reoperation and rehospitalization.  I also discussed potential postoperative recovery.  Surgery scheduled for August 09, 2017.   HPI: Lisa Cline is a very pleasant 32 year old nulliparous woman who is here in consultation at the request of Dr. Ihor Dow for a large abdominopelvic mass causing bilateral ureteral compression.  The patient has a remote history of a left salpingo-oophorectomy and right ovarian cystectomy in 2016 via a low transverse incision for large bilateral dermoid cysts.  She did well after that surgery but began developing symptoms of increasing abdominal girth bloating and early satiety that were most notable in the early part of 2019.  This prompted her undergoing a CT scan of the abdomen and pelvis at no font which revealed a giant abdominopelvic mass measuring 22 x 25 x 13 cm that is predominantly solid and may arrives from the left ovary and extends from the pelvis into the abdomen above the umbilicus.  The anterior uterus is lobular suggesting a fibroid.  There is an apparent right ovarian dermoid containing cyst, fat, and calcification and measures 10 x 7 cm.  There is no pelvic free fluid or definite adenopathy.  There is compression of the ureters and colon causing mild bilateral hydronephrosis.  The  patient is otherwise exceptionally healthy.  She has never been pregnant but desires future fertility.  She is engaged to be married.  Her last Pap smear was unremarkable with no high risk HPV in May 2019.  She takes no routine medications.  Her any prior surgery is the one  listed above.  Her only prior family history for malignancy is a maternal grandmother with breast cancer in her 31s.  Current Meds:  Outpatient Encounter Medications as of 07/27/2017  Medication Sig  . [DISCONTINUED] CALCIUM-MAGNESIUM-ZINC PO Take 3 tablets by mouth daily.  . [DISCONTINUED] ibuprofen (ADVIL,MOTRIN) 800 MG tablet Take 1 tablet (800 mg total) by mouth every 8 (eight) hours as needed (mild pain). (Patient not taking: Reported on 05/10/2014)  . [DISCONTINUED] Multiple Vitamin (MULTIVITAMIN WITH MINERALS) TABS tablet Take 1 tablet by mouth daily.  . [DISCONTINUED] omega-3 acid ethyl esters (LOVAZA) 1 G capsule Take 2 g by mouth daily.  . [DISCONTINUED] Probiotic Product (PROBIOTIC PO) Take 1 capsule by mouth daily.   No facility-administered encounter medications on file as of 07/27/2017.     Allergy: No Known Allergies  Social Hx:   Social History   Socioeconomic History  . Marital status: Single    Spouse name: Not on file  . Number of children: Not on file  . Years of education: Not on file  . Highest education level: Not on file  Occupational History  . Not on file  Social Needs  . Financial resource strain: Not on file  . Food insecurity:    Worry: Not on file    Inability: Not on file  . Transportation needs:    Medical: Not on file    Non-medical: Not on file  Tobacco Use  . Smoking status: Never Smoker  . Smokeless tobacco: Never Used  Substance and Sexual Activity  . Alcohol use: Yes    Comment: socially  . Drug use: No  . Sexual activity: Yes    Birth control/protection: Condom  Lifestyle  . Physical activity:    Days per week: Not on file    Minutes per session: Not on file  . Stress: Not on file  Relationships  . Social connections:    Talks on phone: Not on file    Gets together: Not on file    Attends religious service: Not on file    Active member of club or organization: Not on file    Attends meetings of clubs or organizations: Not on  file    Relationship status: Not on file  . Intimate partner violence:    Fear of current or ex partner: Not on file    Emotionally abused: Not on file    Physically abused: Not on file    Forced sexual activity: Not on file  Other Topics Concern  . Not on file  Social History Narrative  . Not on file    Past Surgical Hx:  Past Surgical History:  Procedure Laterality Date  . LAPAROTOMY N/A 04/23/2014   Procedure: LAPAROTOMY;  Surgeon: Lavonia Drafts, MD;  Location: [facility];  Service: Gynecology;  Laterality: N/A;  . NO PAST SURGERIES    . OVARIAN CYST REMOVAL Left 04/23/2014   Procedure: OVARIAN CYSTECTOMY;  Surgeon: Lavonia Drafts, MD;  Location: [facility];  Service: Gynecology;  Laterality: Left;  . SALPINGOOPHORECTOMY Right 04/23/2014   Procedure: SALPINGO OOPHORECTOMY;  Surgeon: Lavonia Drafts, MD;  Location: [facility];  Service: Gynecology;  Laterality: Right;    Past Medical Hx: History reviewed.  No pertinent past medical history.  Past Gynecological History:  G0 Patient's last menstrual period was 07/03/2017 (approximate).  Family Hx:  Family History  Problem Relation Age of Onset  . Hypertension Father   . Hypertension Mother   . Heart attack Mother   . Hypertension Brother   . Breast cancer Maternal Grandmother     Review of Systems:  Constitutional  Feels well,    ENT Normal appearing ears and nares bilaterally Skin/Breast  No rash, sores, jaundice, itching, dryness Cardiovascular  No chest pain, shortness of breath, or edema  Pulmonary  No cough or wheeze.  Gastro Intestinal  No nausea, vomitting, or diarrhoea. No bright red blood per rectum, no abdominal pain, change in bowel movement, or constipation. + early satiety, + distension. Genito Urinary  No frequency, urgency, dysuria,  Musculo Skeletal  No myalgia, arthralgia, joint swelling or pain  Neurologic  No weakness, numbness, change in gait,  Psychology  No depression, anxiety,  insomnia.   Vitals:  Blood pressure (!) 142/85, pulse 82, temperature 98.2 F (36.8 C), temperature source Oral, resp. rate 20, height 5\' 10"  (1.778 m), weight 221 lb 1.6 oz (100.3 kg), last menstrual period 07/03/2017, SpO2 100 %.  Physical Exam: WD in NAD Neck  Supple NROM, without any enlargements.  Lymph Node Survey No cervical supraclavicular or inguinal adenopathy Cardiovascular  Pulse normal rate, regularity and rhythm. S1 and S2 normal.  Lungs  Clear to auscultation bilateraly, without wheezes/crackles/rhonchi. Good air movement.  Skin  No rash/lesions/breakdown  Psychiatry  Alert and oriented to person, place, and time  Abdomen  Normoactive bowel sounds, abdomen soft, non-tender and nonobese without evidence of hernia. + large abdominal mass, smooth, mobile, fills abdomen to epigastric abdomen. Back No CVA tenderness Genito Urinary  Vulva/vagina: Normal external female genitalia.   No lesions. No discharge or bleeding.  Bladder/urethra:  No lesions or masses, well supported bladder  Vagina:   Cervix: Normal appearing, no lesions.  Uterus:  Small, mobile (feels separate to abdominal mass), no parametrial involvement or nodularity.  Adnexa: no pelvic masses - the abdominal mass appears above the level of the true pelvis. Rectal  deferred Extremities  No bilateral cyanosis, clubbing or edema.   Thereasa Solo, MD  07/27/2017, 1:07 PM

## 2017-07-27 NOTE — Progress Notes (Signed)
Consult Note: Gyn-Onc  Consult was requested by Dr. Ihor Dow for the evaluation of Lisa Cline 32 y.o. female  CC:  Chief Complaint  Patient presents with  . Pelvic mass    Assessment/Plan:  Lisa Cline  is a 32 y.o.  year old with a large abdominopelvic mass likely emanating from the uterus versus left adnexa. She has a history of an LSO, therefore it is more likely that this is a fibroid, however, it was not present in 2016 and therefore has grown to a large size over 3 years.  I am recommending an MRI to better delineate the origins of the mass and its attachments.  We will also obtain tumor markers for solid ovarian tumor such as germ cells and stromal tumors.  I am recommending surgical excision with exploratory laparotomy resection of pelvic mass with possible myomectomy, and right ovarian cystectomy of the apparent right ovarian dermoid cyst.  I explained that abdominal myomectomy of a mass this large is associated with a high risk of intraoperative or postoperative hemorrhage.  We will type and cross the patient preoperatively.  We will have a Cell Saver available to reduce the likelihood of needing transfusion.  We will also use vasopressin intraoperatively injected into the uterus to decrease intraoperative blood loss.  I discussed that myomectomy is associated with increased risk for postoperative fever.  It is associated with an increased risk for postoperative bowel adhesions to the uterus.  It is also associated with increased risk for pregnancy complications including uterine rupture and may require cesarean section delivery in a future pregnancy.  The right ovarian cyst appears most consistent with a dermoid on the written report.  We will better evaluate this with the MRI.  I explained that cystectomy is associated with an increased risk for recurrence of the cyst, however, given that she is premenopausal and age, and has had a prior left  salpingo-nephrectomy, it is most appropriate to preserve whatever ovarian tissue as possible to prevent surgical sterilization.  In doing so she stands an increased risk for requiring reoperation in the future for recurrent cyst.  With her family present I discussed surgical risks as listed above in addition to  bleeding, infection, damage to internal organs (such as bladder,ureters, bowels), blood clot, reoperation and rehospitalization.  I also discussed potential postoperative recovery.  Surgery scheduled for August 09, 2017.   HPI: Lisa Cline is a very pleasant 32 year old nulliparous woman who is here in consultation at the request of Dr. Ihor Dow for a large abdominopelvic mass causing bilateral ureteral compression.  The patient has a remote history of a left salpingo-oophorectomy and right ovarian cystectomy in 2016 via a low transverse incision for large bilateral dermoid cysts.  She did well after that surgery but began developing symptoms of increasing abdominal girth bloating and early satiety that were most notable in the early part of 2019.  This prompted her undergoing a CT scan of the abdomen and pelvis at no font which revealed a giant abdominopelvic mass measuring 22 x 25 x 13 cm that is predominantly solid and may arrives from the left ovary and extends from the pelvis into the abdomen above the umbilicus.  The anterior uterus is lobular suggesting a fibroid.  There is an apparent right ovarian dermoid containing cyst, fat, and calcification and measures 10 x 7 cm.  There is no pelvic free fluid or definite adenopathy.  There is compression of the ureters and colon causing mild bilateral hydronephrosis.  The  patient is otherwise exceptionally healthy.  She has never been pregnant but desires future fertility.  She is engaged to be married.  Her last Pap smear was unremarkable with no high risk HPV in May 2019.  She takes no routine medications.  Her any prior surgery is the one  listed above.  Her only prior family history for malignancy is a maternal grandmother with breast cancer in her 6s.  Current Meds:  Outpatient Encounter Medications as of 07/27/2017  Medication Sig  . [DISCONTINUED] CALCIUM-MAGNESIUM-ZINC PO Take 3 tablets by mouth daily.  . [DISCONTINUED] ibuprofen (ADVIL,MOTRIN) 800 MG tablet Take 1 tablet (800 mg total) by mouth every 8 (eight) hours as needed (mild pain). (Patient not taking: Reported on 05/10/2014)  . [DISCONTINUED] Multiple Vitamin (MULTIVITAMIN WITH MINERALS) TABS tablet Take 1 tablet by mouth daily.  . [DISCONTINUED] omega-3 acid ethyl esters (LOVAZA) 1 G capsule Take 2 g by mouth daily.  . [DISCONTINUED] Probiotic Product (PROBIOTIC PO) Take 1 capsule by mouth daily.   No facility-administered encounter medications on file as of 07/27/2017.     Allergy: No Known Allergies  Social Hx:   Social History   Socioeconomic History  . Marital status: Single    Spouse name: Not on file  . Number of children: Not on file  . Years of education: Not on file  . Highest education level: Not on file  Occupational History  . Not on file  Social Needs  . Financial resource strain: Not on file  . Food insecurity:    Worry: Not on file    Inability: Not on file  . Transportation needs:    Medical: Not on file    Non-medical: Not on file  Tobacco Use  . Smoking status: Never Smoker  . Smokeless tobacco: Never Used  Substance and Sexual Activity  . Alcohol use: Yes    Comment: socially  . Drug use: No  . Sexual activity: Yes    Birth control/protection: Condom  Lifestyle  . Physical activity:    Days per week: Not on file    Minutes per session: Not on file  . Stress: Not on file  Relationships  . Social connections:    Talks on phone: Not on file    Gets together: Not on file    Attends religious service: Not on file    Active member of club or organization: Not on file    Attends meetings of clubs or organizations: Not on  file    Relationship status: Not on file  . Intimate partner violence:    Fear of current or ex partner: Not on file    Emotionally abused: Not on file    Physically abused: Not on file    Forced sexual activity: Not on file  Other Topics Concern  . Not on file  Social History Narrative  . Not on file    Past Surgical Hx:  Past Surgical History:  Procedure Laterality Date  . LAPAROTOMY N/A 04/23/2014   Procedure: LAPAROTOMY;  Surgeon: Lavonia Drafts, MD;  Location: [facility];  Service: Gynecology;  Laterality: N/A;  . NO PAST SURGERIES    . OVARIAN CYST REMOVAL Left 04/23/2014   Procedure: OVARIAN CYSTECTOMY;  Surgeon: Lavonia Drafts, MD;  Location: [facility];  Service: Gynecology;  Laterality: Left;  . SALPINGOOPHORECTOMY Right 04/23/2014   Procedure: SALPINGO OOPHORECTOMY;  Surgeon: Lavonia Drafts, MD;  Location: [facility];  Service: Gynecology;  Laterality: Right;    Past Medical Hx: History reviewed.  No pertinent past medical history.  Past Gynecological History:  G0 Patient's last menstrual period was 07/03/2017 (approximate).  Family Hx:  Family History  Problem Relation Age of Onset  . Hypertension Father   . Hypertension Mother   . Heart attack Mother   . Hypertension Brother   . Breast cancer Maternal Grandmother     Review of Systems:  Constitutional  Feels well,    ENT Normal appearing ears and nares bilaterally Skin/Breast  No rash, sores, jaundice, itching, dryness Cardiovascular  No chest pain, shortness of breath, or edema  Pulmonary  No cough or wheeze.  Gastro Intestinal  No nausea, vomitting, or diarrhoea. No bright red blood per rectum, no abdominal pain, change in bowel movement, or constipation. + early satiety, + distension. Genito Urinary  No frequency, urgency, dysuria,  Musculo Skeletal  No myalgia, arthralgia, joint swelling or pain  Neurologic  No weakness, numbness, change in gait,  Psychology  No depression, anxiety,  insomnia.   Vitals:  Blood pressure (!) 142/85, pulse 82, temperature 98.2 F (36.8 C), temperature source Oral, resp. rate 20, height 5\' 10"  (1.778 m), weight 221 lb 1.6 oz (100.3 kg), last menstrual period 07/03/2017, SpO2 100 %.  Physical Exam: WD in NAD Neck  Supple NROM, without any enlargements.  Lymph Node Survey No cervical supraclavicular or inguinal adenopathy Cardiovascular  Pulse normal rate, regularity and rhythm. S1 and S2 normal.  Lungs  Clear to auscultation bilateraly, without wheezes/crackles/rhonchi. Good air movement.  Skin  No rash/lesions/breakdown  Psychiatry  Alert and oriented to person, place, and time  Abdomen  Normoactive bowel sounds, abdomen soft, non-tender and nonobese without evidence of hernia. + large abdominal mass, smooth, mobile, fills abdomen to epigastric abdomen. Back No CVA tenderness Genito Urinary  Vulva/vagina: Normal external female genitalia.   No lesions. No discharge or bleeding.  Bladder/urethra:  No lesions or masses, well supported bladder  Vagina:   Cervix: Normal appearing, no lesions.  Uterus:  Small, mobile (feels separate to abdominal mass), no parametrial involvement or nodularity.  Adnexa: no pelvic masses - the abdominal mass appears above the level of the true pelvis. Rectal  deferred Extremities  No bilateral cyanosis, clubbing or edema.   Thereasa Solo, MD  07/27/2017, 1:07 PM

## 2017-07-27 NOTE — Patient Instructions (Signed)
Preparing for your Surgery  Plan for surgery on August 09, 2017 with Dr. Everitt Amber at [facility] will be scheduled for a exploratory laparotomy, resection of pelvic mass, possible myomectomy, right ovarian cystectomy.  Pre-operative Testing -You will receive a phone call from presurgical testing at [facility] to arrange for a pre-operative testing appointment before your surgery.  This appointment normally occurs one to two weeks before your scheduled surgery.   -Bring your insurance card, copy of an advanced directive if applicable, medication list  -At that visit, you will be asked to sign a consent for a possible blood transfusion in case a transfusion becomes necessary during surgery.  The need for a blood transfusion is rare but having consent is a necessary part of your care.     -You should not be taking blood thinners or aspirin at least ten days prior to surgery unless instructed by your surgeon.  Day Before Surgery at [facility] will be asked to take in a light diet the day before surgery.  Avoid carbonated beverages.  You will be advised to have nothing to eat or drink after midnight the evening before.    Eat a light diet the day before surgery.  Examples including soups, broths, toast, yogurt, mashed potatoes.  Things to avoid include carbonated beverages (fizzy beverages), raw fruits and raw vegetables, or beans.   If your bowels are filled with gas, your surgeon will have difficulty visualizing your pelvic organs which increases your surgical risks.  Your role in recovery Your role is to become active as soon as directed by your doctor, while still giving yourself time to heal.  Rest when you feel tired. You will be asked to do the following in order to speed your recovery:  - Cough and breathe deeply. This helps toclear and expand your lungs and can prevent pneumonia. You may be given a spirometer to practice deep breathing. A staff member  will show you how to use the spirometer. - Do mild physical activity. Walking or moving your legs help your circulation and body functions return to normal. A staff member will help you when you try to walk and will provide you with simple exercises. Do not try to get up or walk alone the first time. - Actively manage your pain. Managing your pain lets you move in comfort. We will ask you to rate your pain on a scale of zero to 10. It is your responsibility to tell your doctor or nurse where and how much you hurt so your pain can be treated.  Special Considerations -If you are diabetic, you may be placed on insulin after surgery to have closer control over your blood sugars to promote healing and recovery.  This does not mean that you will be discharged on insulin.  If applicable, your oral antidiabetics will be resumed when you are tolerating a solid diet.  -Your final pathology results from surgery should be available by the Friday after surgery and the results will be relayed to you when available.  -Dr. Lahoma Crocker is the Surgeon that assists your GYN Oncologist with surgery.  The next day after your surgery you will either see your GYN Oncologist or Dr. Lahoma Crocker.   Blood Transfusion Information WHAT IS A BLOOD TRANSFUSION? A transfusion is the replacement of blood or some of its parts. Blood is made up of multiple cells which provide different functions.  Red blood cells carry oxygen and are used for  blood loss replacement.  White blood cells fight against infection.  Platelets control bleeding.  Plasma helps clot blood.  Other blood products are available for specialized needs, such as hemophilia or other clotting disorders. BEFORE THE TRANSFUSION  Who gives blood for transfusions?   You may be able to donate blood to be used at a later date on yourself (autologous donation).  Relatives can be asked to donate blood. This is generally not any safer than if you have  received blood from a stranger. The same precautions are taken to ensure safety when a relative's blood is donated.  Healthy volunteers who are fully evaluated to make sure their blood is safe. This is blood bank blood. Transfusion therapy is the safest it has ever been in the practice of medicine. Before blood is taken from a donor, a complete history is taken to make sure that person has no history of diseases nor engages in risky social behavior (examples are intravenous drug use or sexual activity with multiple partners). The donor's travel history is screened to minimize risk of transmitting infections, such as malaria. The donated blood is tested for signs of infectious diseases, such as HIV and hepatitis. The blood is then tested to be sure it is compatible with you in order to minimize the chance of a transfusion reaction. If you or a relative donates blood, this is often done in anticipation of surgery and is not appropriate for emergency situations. It takes many days to process the donated blood. RISKS AND COMPLICATIONS Although transfusion therapy is very safe and saves many lives, the main dangers of transfusion include:   Getting an infectious disease.  Developing a transfusion reaction. This is an allergic reaction to something in the blood you were given. Every precaution is taken to prevent this. The decision to have a blood transfusion has been considered carefully by your caregiver before blood is given. Blood is not given unless the benefits outweigh the risks.

## 2017-07-27 NOTE — Telephone Encounter (Signed)
Called and gave the patient the post op appt

## 2017-07-28 ENCOUNTER — Telehealth: Payer: Self-pay | Admitting: Gynecologic Oncology

## 2017-07-28 NOTE — Telephone Encounter (Signed)
Returned call to patient.  All questions answered.  She reported left sided discomfort that started last pm that is somewhat better this am.  Also reporting intermittent left ankle swelling noted at the end of the day that has been present.  No symptoms of DVT reported.  Advised that the abdominal discomfort should improve and to monitor the LL ankle swelling.  Reportable signs and symptoms reviewed.  Advised to call for any needs, concerns, or new symptoms.

## 2017-08-01 ENCOUNTER — Ambulatory Visit (HOSPITAL_COMMUNITY)
Admission: RE | Admit: 2017-08-01 | Discharge: 2017-08-01 | Disposition: A | Discharge status: Home / Self Care | Payer: 59 | Source: Ambulatory Visit | Attending: Gynecologic Oncology | Admitting: Gynecologic Oncology

## 2017-08-01 ENCOUNTER — Other Ambulatory Visit: Payer: Self-pay | Admitting: Gynecologic Oncology

## 2017-08-01 ENCOUNTER — Telehealth: Payer: Self-pay | Admitting: Gynecologic Oncology

## 2017-08-01 DIAGNOSIS — R188 Other ascites: Secondary | ICD-10-CM | POA: Diagnosis not present

## 2017-08-01 DIAGNOSIS — N949 Unspecified condition associated with female genital organs and menstrual cycle: Secondary | ICD-10-CM

## 2017-08-01 DIAGNOSIS — R1900 Intra-abdominal and pelvic swelling, mass and lump, unspecified site: Secondary | ICD-10-CM | POA: Insufficient documentation

## 2017-08-01 DIAGNOSIS — N1330 Unspecified hydronephrosis: Secondary | ICD-10-CM | POA: Diagnosis not present

## 2017-08-01 DIAGNOSIS — R1032 Left lower quadrant pain: Secondary | ICD-10-CM

## 2017-08-01 MED ORDER — TRAMADOL HCL 50 MG PO TABS: 50.0000 mg | ORAL_TABLET | Freq: Four times a day (QID) | ORAL | 0 refills | Status: DC | PRN

## 2017-08-01 MED ORDER — GADOBENATE DIMEGLUMINE 529 MG/ML IV SOLN
20.0000 mL | Freq: Once | INTRAVENOUS | Status: AC | PRN
  Administered 2017-08-01: 20 mL via INTRAVENOUS

## 2017-08-01 MED ORDER — OXYCODONE HCL 5 MG PO TABS: 5.0000 mg | ORAL_TABLET | Freq: Every evening | ORAL | 0 refills | Status: DC | PRN

## 2017-08-01 NOTE — Progress Notes (Signed)
Patient showed up to the office after her MRI without an appointment complaining of moderate left sided abdominal pain, worse with movement.  She states the pain has worsened and only gets about 4% better after using ibuprofen.  Bowels and bladder functioning without difficulty.  She is concerned about taking too much ibuprofen, esp with her upcoming surgery.  She states she took tramadol in the past when she had cysts before and it offered some relief with no sedating symptoms.  We discussed the use of tramadol during the day for moderate pain but advised not to take and drive.  At night time, when pain is more severe, advised that she could use oxycodone as needed for severe pain and to not take and drive and to not take with tramadol.  She is going to call the office with an update.  We will follow up on the results of her MRI as well and notify her of the results.

## 2017-08-01 NOTE — Telephone Encounter (Signed)
Patient informed of CT scan results.  All questions answered.  Advised to call for any needs.  Advised to keep the office up to date on her pain symptoms.

## 2017-08-05 ENCOUNTER — Encounter (HOSPITAL_COMMUNITY)
Admission: RE | Admit: 2017-08-05 | Discharge: 2017-08-05 | Disposition: A | Discharge status: Home / Self Care | Payer: 59 | Source: Ambulatory Visit | Attending: Gynecologic Oncology | Admitting: Gynecologic Oncology

## 2017-08-05 ENCOUNTER — Encounter (HOSPITAL_COMMUNITY): Payer: Self-pay

## 2017-08-05 ENCOUNTER — Other Ambulatory Visit: Payer: Self-pay

## 2017-08-05 DIAGNOSIS — Z01812 Encounter for preprocedural laboratory examination: Secondary | ICD-10-CM | POA: Insufficient documentation

## 2017-08-05 LAB — CBC WITH DIFFERENTIAL/PLATELET
BASOS ABS: 0 10*3/uL (ref 0.0–0.1)
Basophils Relative: 1 %
Eosinophils Absolute: 0 10*3/uL (ref 0.0–0.7)
Eosinophils Relative: 1 %
HEMATOCRIT: 37.4 % (ref 36.0–46.0)
HEMOGLOBIN: 13.3 g/dL (ref 12.0–15.0)
Lymphocytes Relative: 39 %
Lymphs Abs: 2.5 10*3/uL (ref 0.7–4.0)
MCH: 33.2 pg (ref 26.0–34.0)
MCHC: 35.6 g/dL (ref 30.0–36.0)
MCV: 93.3 fL (ref 78.0–100.0)
Monocytes Absolute: 0.5 10*3/uL (ref 0.1–1.0)
Monocytes Relative: 8 %
NEUTROS ABS: 3.3 10*3/uL (ref 1.7–7.7)
NEUTROS PCT: 51 %
Platelets: 271 10*3/uL (ref 150–400)
RBC: 4.01 MIL/uL (ref 3.87–5.11)
RDW: 12.5 % (ref 11.5–15.5)
WBC: 6.3 10*3/uL (ref 4.0–10.5)

## 2017-08-05 LAB — COMPREHENSIVE METABOLIC PANEL
ALBUMIN: 4.1 g/dL (ref 3.5–5.0)
ALK PHOS: 41 U/L (ref 38–126)
ALT: 12 U/L — ABNORMAL LOW (ref 14–54)
AST: 19 U/L (ref 15–41)
Anion gap: 7 (ref 5–15)
BILIRUBIN TOTAL: 1.1 mg/dL (ref 0.3–1.2)
BUN: 8 mg/dL (ref 6–20)
CALCIUM: 9.2 mg/dL (ref 8.9–10.3)
CO2: 26 mmol/L (ref 22–32)
Chloride: 107 mmol/L (ref 101–111)
Creatinine, Ser: 0.68 mg/dL (ref 0.44–1.00)
GFR calc Af Amer: >60 mL/min (ref >60)
GFR calc non Af Amer: >60 mL/min (ref >60)
GLUCOSE: 83 mg/dL (ref 65–99)
POTASSIUM: 4.4 mmol/L (ref 3.5–5.1)
Sodium: 140 mmol/L (ref 135–145)
TOTAL PROTEIN: 7.2 g/dL (ref 6.5–8.1)

## 2017-08-05 LAB — URINALYSIS, ROUTINE W REFLEX MICROSCOPIC
Bilirubin Urine: NEGATIVE
Glucose, UA: NEGATIVE mg/dL
Hgb urine dipstick: NEGATIVE
Ketones, ur: 5 mg/dL — AB
LEUKOCYTES UA: NEGATIVE
NITRITE: NEGATIVE
PH: 7 (ref 5.0–8.0)
Protein, ur: NEGATIVE mg/dL
Specific Gravity, Urine: 1.016 (ref 1.005–1.030)

## 2017-08-05 LAB — ABO/RH: ABO/RH(D): O POS

## 2017-08-05 LAB — PREGNANCY, URINE: Preg Test, Ur: NEGATIVE

## 2017-08-05 LAB — LACTATE DEHYDROGENASE: LDH: 131 U/L (ref 98–192)

## 2017-08-05 NOTE — Patient Instructions (Signed)
Lisa Cline  08/05/2017   Your procedure is scheduled on: 08-09-17  Report to [facility] Main  Entrance  Report to admitting at 530 AM    Call this number if you have problems the morning of surgery [phone]   Remember: Do not eat food  :After Midnight.  NO SOLID FOOD AFTER MIDNIGHT THE NIGHT PRIOR TO SURGERY. NOTHING BY MOUTH EXCEPT CLEAR LIQUIDS UNTIL 3 HOURS PRIOR TO [place] SURGERY. PLEASE FINISH ENSURE DRINK PER SURGEON ORDER 3 HOURS PRIOR TO SCHEDULED SURGERY TIME WHICH NEEDS TO BE COMPLETED AT 430 AM    CLEAR LIQUID DIET   Foods Allowed                                                                     Foods Excluded  Coffee and tea, regular and decaf                             liquids that you cannot  Plain Jell-O in any flavor                                             see through such as: Fruit ices (not with fruit pulp)                                     milk, soups, orange juice  Iced Popsicles                                    All solid food Carbonated beverages, regular and diet                                    Cranberry, grape and apple juices Sports drinks like Gatorade Lightly seasoned clear broth or consume(fat free) Sugar, honey syrup  Sample Menu Breakfast                                Lunch                                     Supper Cranberry juice                    Beef broth                            Chicken broth Jell-O                                     Grape juice  Apple juice Coffee or tea                        Jell-O                                      Popsicle                                                Coffee or tea                        Coffee or tea  _____________________________________________________________________    Take these medicines the morning of surgery with A SIP OF WATER: NONE                               You may not have any metal on your body  including hair pins and              piercings  Do not wear jewelry, make-up, lotions, powders or perfumes, deodorant             Do not wear nail polish.  Do not shave  48 hours prior to surgery.              Men may shave face and neck.   Do not bring valuables to the hospital. [place].  Contacts, dentures or bridgework may not be worn into surgery.  Leave suitcase in the car. After surgery it may be brought to your room.                  Please read over the following fact sheets you were given: _____________________________________________________________________  [facility] - Preparing for Surgery Before surgery, you can play an important role.  Because skin is not sterile, your skin needs to be as free of germs as possible.  You can reduce the number of germs on your skin by washing with CHG (chlorahexidine gluconate) soap before surgery.  CHG is an antiseptic cleaner which kills germs and bonds with the skin to continue killing germs even after washing. Please DO NOT use if you have an allergy to CHG or antibacterial soaps.  If your skin becomes reddened/irritated stop using the CHG and inform your nurse when you arrive at Short Stay. Do not shave (including legs and underarms) for at least 48 hours prior to the first CHG shower.  You may shave your face/neck. Please follow these instructions carefully:  1.  Shower with CHG Soap the night before surgery and the  morning of Surgery.  2.  If you choose to wash your hair, wash your hair first as usual with your  normal  shampoo.  3.  After you shampoo, rinse your hair and body thoroughly to remove the  shampoo.                           4.  Use CHG as you would any other liquid soap.  You can apply chg directly  to the skin and  wash                       Gently with a scrungie or clean washcloth.  5.  Apply the CHG Soap to your body ONLY FROM THE NECK DOWN.   Do not use on face/ open                            Wound or open sores. Avoid contact with eyes, ears mouth and genitals (private parts).                       Wash face,  Genitals (private parts) with your normal soap.             6.  Wash thoroughly, paying special attention to the area where your surgery  will be performed.  7.  Thoroughly rinse your body with warm water from the neck down.  8.  DO NOT shower/wash with your normal soap after using and rinsing off  the CHG Soap.                9.  Pat yourself dry with a clean towel.            10.  Wear clean pajamas.            11.  Place clean sheets on your bed the night of your first shower and do not  sleep with pets. Day of Surgery : Do not apply any lotions/deodorants the morning of surgery.  Please wear clean clothes to the hospital/surgery center.  FAILURE TO FOLLOW THESE INSTRUCTIONS MAY RESULT IN THE CANCELLATION OF YOUR SURGERY PATIENT SIGNATURE_________________________________  NURSE SIGNATURE__________________________________  ________________________________________________________________________   Lisa Cline  An incentive spirometer is a tool that can help keep your lungs clear and active. This tool measures how well you are filling your lungs with each breath. Taking long deep breaths may help reverse or decrease the chance of developing breathing (pulmonary) problems (especially infection) following:  A long period of time when you are unable to move or be active. BEFORE THE PROCEDURE   If the spirometer includes an indicator to show your best effort, your nurse or respiratory therapist will set it to a desired goal.  If possible, sit up straight or lean slightly forward. Try not to slouch.  Hold the incentive spirometer in an upright position. INSTRUCTIONS FOR USE  1. Sit on the edge of your bed if possible, or sit up as far as you can in bed or on a chair. 2. Hold the incentive spirometer in an upright position. 3. Breathe out  normally. 4. Place the mouthpiece in your mouth and seal your lips tightly around it. 5. Breathe in slowly and as deeply as possible, raising the piston or the ball toward the top of the column. 6. Hold your breath for 3-5 seconds or for as long as possible. Allow the piston or ball to fall to the bottom of the column. 7. Remove the mouthpiece from your mouth and breathe out normally. 8. Rest for a few seconds and repeat Steps 1 through 7 at least 10 times every 1-2 hours when you are awake. Take your time and take a few normal breaths between deep breaths. 9. The spirometer may include an indicator to show your best effort. Use the indicator as a goal to work toward during each repetition. 10. After each set of 10  deep breaths, practice coughing to be sure your lungs are clear. If you have an incision (the cut made at the time of surgery), support your incision when coughing by placing a pillow or rolled up towels firmly against it. Once you are able to get out of bed, walk around indoors and cough well. You may stop using the incentive spirometer when instructed by your caregiver.  RISKS AND COMPLICATIONS  Take your time so you do not get dizzy or light-headed.  If you are in pain, you may need to take or ask for pain medication before doing incentive spirometry. It is harder to take a deep breath if you are having pain. AFTER USE  Rest and breathe slowly and easily.  It can be helpful to keep track of a log of your progress. Your caregiver can provide you with a simple table to help with this. If you are using the spirometer at home, follow these instructions: [place] IF:   You are having difficultly using the spirometer.  You have trouble using the spirometer as often as instructed.  Your pain medication is not giving enough relief while using the spirometer.  You develop fever of 100.5 F (38.1 C) or higher. SEEK IMMEDIATE MEDICAL CARE IF:   You cough up bloody sputum  that had not been present before.  You develop fever of 102 F (38.9 C) or greater.  You develop worsening pain at or near the incision site. MAKE SURE YOU:   Understand these instructions.  Will watch your condition.  Will get help right away if you are not doing well or get worse. Document Released: 06/21/2006 Document Revised: 05/03/2011 Document Reviewed: 08/22/2006 [facility] Patient Information 2014 [place], [place].  WALK OFTEN AND MOVE AROUND IN BED AFTER SURGERY TO PREVENT PNEUMONIA! ________________________________________________________________________  WHAT IS A BLOOD TRANSFUSION? Blood Transfusion Information  A transfusion is the replacement of blood or some of its parts. Blood is made up of multiple cells which provide different functions.  Red blood cells carry oxygen and are used for blood loss replacement.  White blood cells fight against infection.  Platelets control bleeding.  Plasma helps clot blood.  Other blood products are available for specialized needs, such as hemophilia or other clotting disorders. BEFORE THE TRANSFUSION  Who gives blood for transfusions?   Healthy volunteers who are fully evaluated to make sure their blood is safe. This is blood bank blood. Transfusion therapy is the safest it has ever been in the practice of medicine. Before blood is taken from a donor, a complete history is taken to make sure that person has no history of diseases nor engages in risky social behavior (examples are intravenous drug use or sexual activity with multiple partners). The donor's travel history is screened to minimize risk of transmitting infections, such as malaria. The donated blood is tested for signs of infectious diseases, such as HIV and hepatitis. The blood is then tested to be sure it is compatible with you in order to minimize the chance of a transfusion reaction. If you or a relative donates blood, this is often done in anticipation of surgery and  is not appropriate for emergency situations. It takes many days to process the donated blood. RISKS AND COMPLICATIONS Although transfusion therapy is very safe and saves many lives, the main dangers of transfusion include:   Getting an infectious disease.  Developing a transfusion reaction. This is an allergic reaction to something in the blood you were given. Every precaution is  taken to prevent this. The decision to have a blood transfusion has been considered carefully by your caregiver before blood is given. Blood is not given unless the benefits outweigh the risks. AFTER THE TRANSFUSION  Right after receiving a blood transfusion, you will usually feel much better and more energetic. This is especially true if your red blood cells have gotten low (anemic). The transfusion raises the level of the red blood cells which carry oxygen, and this usually causes an energy increase.  The nurse administering the transfusion will monitor you carefully for complications. HOME CARE INSTRUCTIONS  No special instructions are needed after a transfusion. You may find your energy is better. Speak with your caregiver about any limitations on activity for underlying diseases you may have. SEEK MEDICAL CARE IF:   Your condition is not improving after your transfusion.  You develop redness or irritation at the intravenous (IV) site. SEEK IMMEDIATE MEDICAL CARE IF:  Any of the following symptoms occur over the next 12 hours:  Shaking chills.  You have a temperature by mouth above 102 F (38.9 C), not controlled by medicine.  Chest, back, or muscle pain.  People around you feel you are not acting correctly or are confused.  Shortness of breath or difficulty breathing.  Dizziness and fainting.  You get a rash or develop hives.  You have a decrease in urine output.  Your urine turns a dark color or changes to pink, red, or brown. Any of the following symptoms occur over the next 10 days:  You  have a temperature by mouth above 102 F (38.9 C), not controlled by medicine.  Shortness of breath.  Weakness after normal activity.  The white part of the eye turns yellow (jaundice).  You have a decrease in the amount of urine or are urinating less often.  Your urine turns a dark color or changes to pink, red, or brown. Document Released: 02/06/2000 Document Revised: 05/03/2011 Document Reviewed: 09/25/2007 [facility] Patient Information 2014 [place], [place].  _______________________________________________________________________

## 2017-08-06 LAB — CEA: CEA: 1.2 ng/mL (ref 0.0–4.7)

## 2017-08-06 LAB — AFP TUMOR MARKER: AFP, SERUM, TUMOR MARKER: 3.4 ng/mL (ref 0.0–8.3)

## 2017-08-06 LAB — CA 125: Cancer Antigen (CA) 125: 29.5 U/mL (ref 0.0–38.1)

## 2017-08-09 ENCOUNTER — Encounter (HOSPITAL_COMMUNITY): Payer: Self-pay | Admitting: *Deleted

## 2017-08-09 ENCOUNTER — Other Ambulatory Visit: Payer: Self-pay

## 2017-08-09 ENCOUNTER — Inpatient Hospital Stay (HOSPITAL_COMMUNITY): Payer: 59 | Admitting: Anesthesiology

## 2017-08-09 ENCOUNTER — Encounter (HOSPITAL_COMMUNITY)
Admission: RE | Disposition: A | Discharge status: Home / Self Care | Payer: Self-pay | Source: Ambulatory Visit | Attending: Gynecologic Oncology

## 2017-08-09 ENCOUNTER — Inpatient Hospital Stay (HOSPITAL_COMMUNITY)
Admission: RE | Admit: 2017-08-09 | Discharge: 2017-08-11 | DRG: 330 | Disposition: A | Discharge status: Home / Self Care | Payer: 59 | Source: Ambulatory Visit | Attending: Gynecologic Oncology | Admitting: Gynecologic Oncology

## 2017-08-09 DIAGNOSIS — R1900 Intra-abdominal and pelvic swelling, mass and lump, unspecified site: Secondary | ICD-10-CM | POA: Diagnosis present

## 2017-08-09 DIAGNOSIS — R141 Gas pain: Secondary | ICD-10-CM

## 2017-08-09 DIAGNOSIS — N83201 Unspecified ovarian cyst, right side: Secondary | ICD-10-CM | POA: Diagnosis present

## 2017-08-09 DIAGNOSIS — D252 Subserosal leiomyoma of uterus: Secondary | ICD-10-CM | POA: Diagnosis present

## 2017-08-09 DIAGNOSIS — D62 Acute posthemorrhagic anemia: Secondary | ICD-10-CM | POA: Diagnosis present

## 2017-08-09 HISTORY — PX: MYOMECTOMY: SHX85

## 2017-08-09 HISTORY — PX: COLON RESECTION SIGMOID: SHX6737

## 2017-08-09 HISTORY — PX: LAPAROTOMY: SHX154

## 2017-08-09 HISTORY — PX: MASS EXCISION: SHX2000

## 2017-08-09 HISTORY — PX: OVARIAN CYST REMOVAL: SHX89

## 2017-08-09 LAB — BASIC METABOLIC PANEL
Anion gap: 5 (ref 5–15)
BUN: 6 mg/dL (ref 6–20)
CO2: 26 mmol/L (ref 22–32)
CREATININE: 0.64 mg/dL (ref 0.44–1.00)
Calcium: 7.8 mg/dL — ABNORMAL LOW (ref 8.9–10.3)
Chloride: 111 mmol/L (ref 101–111)
GFR calc non Af Amer: >60 mL/min (ref >60)
GLUCOSE: 140 mg/dL — AB (ref 65–99)
Potassium: 4.3 mmol/L (ref 3.5–5.1)
Sodium: 142 mmol/L (ref 135–145)

## 2017-08-09 LAB — PREPARE RBC (CROSSMATCH)

## 2017-08-09 LAB — CBC
HEMATOCRIT: 32.4 % — AB (ref 36.0–46.0)
HEMATOCRIT: 31.7 % — AB (ref 36.0–46.0)
Hemoglobin: 11.2 g/dL — ABNORMAL LOW (ref 12.0–15.0)
Hemoglobin: 11.1 g/dL — ABNORMAL LOW (ref 12.0–15.0)
MCH: 32.6 pg (ref 26.0–34.0)
MCH: 32.8 pg (ref 26.0–34.0)
MCHC: 34.6 g/dL (ref 30.0–36.0)
MCHC: 35 g/dL (ref 30.0–36.0)
MCV: 94.2 fL (ref 78.0–100.0)
MCV: 93.8 fL (ref 78.0–100.0)
PLATELETS: 295 10*3/uL (ref 150–400)
Platelets: 266 10*3/uL (ref 150–400)
RBC: 3.44 MIL/uL — ABNORMAL LOW (ref 3.87–5.11)
RBC: 3.38 MIL/uL — ABNORMAL LOW (ref 3.87–5.11)
RDW: 12.6 % (ref 11.5–15.5)
RDW: 12.6 % (ref 11.5–15.5)
WBC: 14.9 10*3/uL — ABNORMAL HIGH (ref 4.0–10.5)
WBC: 14.8 10*3/uL — ABNORMAL HIGH (ref 4.0–10.5)

## 2017-08-09 LAB — INHIBIN B: INHIBIN B: 17.4 pg/mL

## 2017-08-09 SURGERY — LAPAROTOMY
Anesthesia: General | Laterality: Right

## 2017-08-09 MED ORDER — FENTANYL CITRATE (PF) 100 MCG/2ML IJ SOLN
25.0000 ug | INTRAMUSCULAR | Status: DC | PRN
  Administered 2017-08-09: 50 ug via INTRAVENOUS

## 2017-08-09 MED ORDER — LIDOCAINE 2% (20 MG/ML) 5 ML SYRINGE
INTRAMUSCULAR | Status: DC | PRN
  Administered 2017-08-09: 50 mg via INTRAVENOUS

## 2017-08-09 MED ORDER — ENOXAPARIN SODIUM 40 MG/0.4ML ~~LOC~~ SOLN
40.0000 mg | SUBCUTANEOUS | Status: DC
  Administered 2017-08-10 – 2017-08-11 (×2): 40 mg via SUBCUTANEOUS
  Filled 2017-08-09 (×3): qty 0.4

## 2017-08-09 MED ORDER — ENOXAPARIN SODIUM 40 MG/0.4ML ~~LOC~~ SOLN
40.0000 mg | SUBCUTANEOUS | Status: AC
  Administered 2017-08-09: 40 mg via SUBCUTANEOUS
  Filled 2017-08-09: qty 0.4

## 2017-08-09 MED ORDER — DEXAMETHASONE SODIUM PHOSPHATE 10 MG/ML IJ SOLN
INTRAMUSCULAR | Status: DC | PRN
  Administered 2017-08-09: 10 mg via INTRAVENOUS

## 2017-08-09 MED ORDER — SUGAMMADEX SODIUM 200 MG/2ML IV SOLN
INTRAVENOUS | Status: AC
  Filled 2017-08-09: qty 2

## 2017-08-09 MED ORDER — OXYCODONE HCL 5 MG PO TABS
5.0000 mg | ORAL_TABLET | ORAL | Status: DC | PRN
  Administered 2017-08-09: 5 mg via ORAL
  Filled 2017-08-09: qty 1

## 2017-08-09 MED ORDER — DEXAMETHASONE SODIUM PHOSPHATE 4 MG/ML IJ SOLN: 4.0000 mg | INTRAMUSCULAR | Status: DC

## 2017-08-09 MED ORDER — OXYCODONE HCL 5 MG PO TABS: 5.0000 mg | ORAL_TABLET | Freq: Once | ORAL | Status: DC | PRN

## 2017-08-09 MED ORDER — ONDANSETRON HCL 4 MG/2ML IJ SOLN
INTRAMUSCULAR | Status: AC
  Filled 2017-08-09: qty 2

## 2017-08-09 MED ORDER — ONDANSETRON HCL 4 MG/2ML IJ SOLN
INTRAMUSCULAR | Status: DC | PRN
  Administered 2017-08-09: 4 mg via INTRAVENOUS

## 2017-08-09 MED ORDER — FENTANYL CITRATE (PF) 100 MCG/2ML IJ SOLN
INTRAMUSCULAR | Status: DC | PRN
  Administered 2017-08-09 (×2): 50 ug via INTRAVENOUS
  Administered 2017-08-09: 100 ug via INTRAVENOUS
  Administered 2017-08-09: 50 ug via INTRAVENOUS

## 2017-08-09 MED ORDER — ACETAMINOPHEN 500 MG PO TABS
1000.0000 mg | ORAL_TABLET | ORAL | Status: AC
  Administered 2017-08-09: 1000 mg via ORAL
  Filled 2017-08-09: qty 2

## 2017-08-09 MED ORDER — HYDROMORPHONE HCL 1 MG/ML IJ SOLN: 0.5000 mg | INTRAMUSCULAR | Status: DC | PRN

## 2017-08-09 MED ORDER — ACETAMINOPHEN 500 MG PO TABS
1000.0000 mg | ORAL_TABLET | Freq: Four times a day (QID) | ORAL | Status: DC
  Administered 2017-08-09 – 2017-08-11 (×8): 1000 mg via ORAL
  Filled 2017-08-09 (×9): qty 2

## 2017-08-09 MED ORDER — ENSURE ENLIVE PO LIQD
237.0000 mL | Freq: Two times a day (BID) | ORAL | Status: DC
  Administered 2017-08-09 – 2017-08-11 (×3): 237 mL via ORAL

## 2017-08-09 MED ORDER — SUGAMMADEX SODIUM 200 MG/2ML IV SOLN
INTRAVENOUS | Status: DC | PRN
  Administered 2017-08-09: 200 mg via INTRAVENOUS

## 2017-08-09 MED ORDER — ROCURONIUM BROMIDE 10 MG/ML (PF) SYRINGE
PREFILLED_SYRINGE | INTRAVENOUS | Status: DC | PRN
  Administered 2017-08-09: 10 mg via INTRAVENOUS
  Administered 2017-08-09: 40 mg via INTRAVENOUS
  Administered 2017-08-09 (×2): 10 mg via INTRAVENOUS
  Administered 2017-08-09: 20 mg via INTRAVENOUS

## 2017-08-09 MED ORDER — SODIUM CHLORIDE 0.9 % IV SOLN
INTRAVENOUS | Status: DC
  Filled 2017-08-09: qty 30

## 2017-08-09 MED ORDER — ONDANSETRON HCL 4 MG PO TABS: 4.0000 mg | ORAL_TABLET | Freq: Four times a day (QID) | ORAL | Status: DC | PRN

## 2017-08-09 MED ORDER — KCL IN DEXTROSE-NACL 20-5-0.45 MEQ/L-%-% IV SOLN
INTRAVENOUS | Status: DC
  Administered 2017-08-09: 14:00:00 via INTRAVENOUS
  Filled 2017-08-09: qty 1000

## 2017-08-09 MED ORDER — HYDROMORPHONE HCL 2 MG/ML IJ SOLN
INTRAMUSCULAR | Status: AC
  Filled 2017-08-09: qty 1

## 2017-08-09 MED ORDER — SODIUM CHLORIDE 0.9 % IV SOLN: INTRAVENOUS | Status: DC

## 2017-08-09 MED ORDER — SODIUM CHLORIDE 0.9 % IV SOLN
INTRAVENOUS | Status: DC | PRN
  Administered 2017-08-09: 09:00:00 via INTRAVENOUS

## 2017-08-09 MED ORDER — ROCURONIUM BROMIDE 100 MG/10ML IV SOLN
INTRAVENOUS | Status: AC
  Filled 2017-08-09: qty 1

## 2017-08-09 MED ORDER — KETOROLAC TROMETHAMINE 30 MG/ML IJ SOLN
30.0000 mg | Freq: Four times a day (QID) | INTRAMUSCULAR | Status: AC
  Administered 2017-08-09 – 2017-08-10 (×4): 30 mg via INTRAVENOUS
  Filled 2017-08-09 (×4): qty 1

## 2017-08-09 MED ORDER — DEXAMETHASONE SODIUM PHOSPHATE 10 MG/ML IJ SOLN
INTRAMUSCULAR | Status: AC
  Filled 2017-08-09: qty 1

## 2017-08-09 MED ORDER — OXYCODONE HCL 5 MG/5ML PO SOLN
5.0000 mg | Freq: Once | ORAL | Status: DC | PRN
  Filled 2017-08-09: qty 5

## 2017-08-09 MED ORDER — ONDANSETRON HCL 4 MG/2ML IJ SOLN: 4.0000 mg | Freq: Four times a day (QID) | INTRAMUSCULAR | Status: DC | PRN

## 2017-08-09 MED ORDER — PHENYLEPHRINE 40 MCG/ML (10ML) SYRINGE FOR IV PUSH (FOR BLOOD PRESSURE SUPPORT)
PREFILLED_SYRINGE | INTRAVENOUS | Status: AC
  Filled 2017-08-09: qty 10

## 2017-08-09 MED ORDER — LIDOCAINE 2% (20 MG/ML) 5 ML SYRINGE
INTRAMUSCULAR | Status: AC
  Filled 2017-08-09: qty 5

## 2017-08-09 MED ORDER — ONDANSETRON HCL 4 MG/2ML IJ SOLN: 4.0000 mg | Freq: Once | INTRAMUSCULAR | Status: DC | PRN

## 2017-08-09 MED ORDER — CHEWING GUM (ORBIT) SUGAR FREE
1.0000 | CHEWING_GUM | Freq: Three times a day (TID) | ORAL | Status: DC
  Administered 2017-08-09 – 2017-08-11 (×6): 1 via ORAL
  Filled 2017-08-09: qty 1

## 2017-08-09 MED ORDER — ENOXAPARIN (LOVENOX) PATIENT EDUCATION KIT
PACK | Freq: Once | Status: AC
  Administered 2017-08-09: 17:00:00
  Filled 2017-08-09: qty 1

## 2017-08-09 MED ORDER — BUPIVACAINE HCL 0.25 % IJ SOLN
INTRAMUSCULAR | Status: DC | PRN
  Administered 2017-08-09: 30 mL

## 2017-08-09 MED ORDER — HYDROMORPHONE HCL 1 MG/ML IJ SOLN
INTRAMUSCULAR | Status: DC | PRN
  Administered 2017-08-09 (×4): 0.5 mg via INTRAVENOUS

## 2017-08-09 MED ORDER — SENNOSIDES-DOCUSATE SODIUM 8.6-50 MG PO TABS
2.0000 | ORAL_TABLET | Freq: Every day | ORAL | Status: DC
  Administered 2017-08-09: 1 via ORAL
  Administered 2017-08-10: 2 via ORAL
  Filled 2017-08-09 (×2): qty 2

## 2017-08-09 MED ORDER — SODIUM CHLORIDE 0.9% IV SOLUTION: Freq: Once | INTRAVENOUS | Status: DC

## 2017-08-09 MED ORDER — PREGABALIN 75 MG PO CAPS
75.0000 mg | ORAL_CAPSULE | Freq: Two times a day (BID) | ORAL | Status: DC
  Administered 2017-08-10 – 2017-08-11 (×3): 75 mg via ORAL
  Filled 2017-08-09 (×3): qty 1

## 2017-08-09 MED ORDER — PHENYLEPHRINE 40 MCG/ML (10ML) SYRINGE FOR IV PUSH (FOR BLOOD PRESSURE SUPPORT)
PREFILLED_SYRINGE | INTRAVENOUS | Status: DC | PRN
  Administered 2017-08-09: 80 ug via INTRAVENOUS

## 2017-08-09 MED ORDER — LACTATED RINGERS IV SOLN
INTRAVENOUS | Status: DC | PRN
  Administered 2017-08-09 (×2): via INTRAVENOUS

## 2017-08-09 MED ORDER — SODIUM CHLORIDE 0.9 % IJ SOLN
INTRAMUSCULAR | Status: DC | PRN
  Administered 2017-08-09: 20 mL

## 2017-08-09 MED ORDER — CEFAZOLIN SODIUM-DEXTROSE 2-4 GM/100ML-% IV SOLN
INTRAVENOUS | Status: AC
  Filled 2017-08-09: qty 100

## 2017-08-09 MED ORDER — MIDAZOLAM HCL 2 MG/2ML IJ SOLN
INTRAMUSCULAR | Status: AC
  Filled 2017-08-09: qty 2

## 2017-08-09 MED ORDER — 0.9 % SODIUM CHLORIDE (POUR BTL) OPTIME
TOPICAL | Status: DC | PRN
  Administered 2017-08-09: 4000 mL

## 2017-08-09 MED ORDER — NON FORMULARY: 1.0000 [IU] | Freq: Three times a day (TID) | Status: DC

## 2017-08-09 MED ORDER — ALBUMIN HUMAN 5 % IV SOLN
INTRAVENOUS | Status: DC | PRN
  Administered 2017-08-09 (×2): via INTRAVENOUS

## 2017-08-09 MED ORDER — FENTANYL CITRATE (PF) 100 MCG/2ML IJ SOLN
INTRAMUSCULAR | Status: AC
  Filled 2017-08-09: qty 2

## 2017-08-09 MED ORDER — BUPIVACAINE LIPOSOME 1.3 % IJ SUSP
20.0000 mL | Freq: Once | INTRAMUSCULAR | Status: AC
  Administered 2017-08-09: 20 mL
  Filled 2017-08-09: qty 20

## 2017-08-09 MED ORDER — CEFAZOLIN SODIUM-DEXTROSE 2-4 GM/100ML-% IV SOLN
2.0000 g | INTRAVENOUS | Status: AC
  Administered 2017-08-09: 2 g via INTRAVENOUS

## 2017-08-09 MED ORDER — PROPOFOL 10 MG/ML IV BOLUS
INTRAVENOUS | Status: AC
  Filled 2017-08-09: qty 20

## 2017-08-09 MED ORDER — KETOROLAC TROMETHAMINE 30 MG/ML IJ SOLN: 30.0000 mg | Freq: Four times a day (QID) | INTRAMUSCULAR | Status: AC

## 2017-08-09 MED ORDER — BUPIVACAINE HCL (PF) 0.25 % IJ SOLN
INTRAMUSCULAR | Status: AC
  Filled 2017-08-09: qty 30

## 2017-08-09 MED ORDER — SCOPOLAMINE 1 MG/3DAYS TD PT72
1.0000 | MEDICATED_PATCH | TRANSDERMAL | Status: DC
  Administered 2017-08-09: 1.5 mg via TRANSDERMAL
  Filled 2017-08-09: qty 1

## 2017-08-09 MED ORDER — CELECOXIB 200 MG PO CAPS
400.0000 mg | ORAL_CAPSULE | ORAL | Status: AC
  Administered 2017-08-09: 400 mg via ORAL
  Filled 2017-08-09: qty 2

## 2017-08-09 MED ORDER — PROPOFOL 10 MG/ML IV BOLUS
INTRAVENOUS | Status: DC | PRN
  Administered 2017-08-09: 170 mg via INTRAVENOUS

## 2017-08-09 MED ORDER — GABAPENTIN 300 MG PO CAPS
300.0000 mg | ORAL_CAPSULE | ORAL | Status: AC
  Administered 2017-08-09: 300 mg via ORAL
  Filled 2017-08-09: qty 1

## 2017-08-09 MED ORDER — ALBUMIN HUMAN 5 % IV SOLN
INTRAVENOUS | Status: AC
  Filled 2017-08-09: qty 500

## 2017-08-09 MED ORDER — SODIUM CHLORIDE 0.9 % IV SOLN
2.0000 g | Freq: Once | INTRAVENOUS | Status: AC
  Administered 2017-08-09: 2 g via INTRAVENOUS
  Filled 2017-08-09: qty 2

## 2017-08-09 MED ORDER — MIDAZOLAM HCL 5 MG/5ML IJ SOLN
INTRAMUSCULAR | Status: DC | PRN
  Administered 2017-08-09: 2 mg via INTRAVENOUS

## 2017-08-09 MED ORDER — SODIUM CHLORIDE 0.9 % IJ SOLN
INTRAMUSCULAR | Status: AC
  Filled 2017-08-09: qty 20

## 2017-08-09 MED ORDER — IBUPROFEN 200 MG PO TABS
600.0000 mg | ORAL_TABLET | Freq: Four times a day (QID) | ORAL | Status: DC
  Administered 2017-08-10 – 2017-08-11 (×5): 600 mg via ORAL
  Filled 2017-08-09 (×5): qty 3

## 2017-08-09 MED ORDER — LACTATED RINGERS IV SOLN: INTRAVENOUS | Status: DC

## 2017-08-09 MED ORDER — FENTANYL CITRATE (PF) 250 MCG/5ML IJ SOLN
INTRAMUSCULAR | Status: AC
  Filled 2017-08-09: qty 5

## 2017-08-09 SURGICAL SUPPLY — 75 items
ATTRACTOMAT 16X20 MAGNETIC DRP (DRAPES) ×4 IMPLANT
BINDER ABDOMINAL 12 ML 46-62 (SOFTGOODS) ×4 IMPLANT
BLADE EXTENDED COATED 6.5IN (ELECTRODE) ×4 IMPLANT
CELLS DAT CNTRL 66122 CELL SVR (MISCELLANEOUS) IMPLANT
CHLORAPREP W/TINT 26ML (MISCELLANEOUS) ×4 IMPLANT
CLEANER TIP ELECTROSURG 2X2 (MISCELLANEOUS) ×4 IMPLANT
CLIP VESOCCLUDE LG 6/CT (CLIP) ×4 IMPLANT
CLIP VESOCCLUDE MED 6/CT (CLIP) ×4 IMPLANT
CLIP VESOCCLUDE MED LG 6/CT (CLIP) ×4 IMPLANT
CONT SPECI 4OZ STER CLIK (MISCELLANEOUS) ×8 IMPLANT
COUNTER NEEDLE 20 DBL MAG RED (NEEDLE) ×4 IMPLANT
COVER MAYO STAND STRL (DRAPES) ×12 IMPLANT
COVER SURGICAL LIGHT HANDLE (MISCELLANEOUS) ×8 IMPLANT
DERMABOND ADVANCED (GAUZE/BANDAGES/DRESSINGS) ×1
DERMABOND ADVANCED .7 DNX12 (GAUZE/BANDAGES/DRESSINGS) ×3 IMPLANT
DRAPE INCISE 23X17 IOBAN STRL (DRAPES) ×1
DRAPE INCISE IOBAN 23X17 STRL (DRAPES) ×3 IMPLANT
DRAPE INCISE IOBAN 66X45 STRL (DRAPES) IMPLANT
DRAPE LG THREE QUARTER DISP (DRAPES) ×8 IMPLANT
DRAPE UTILITY XL STRL (DRAPES) ×4 IMPLANT
DRAPE WARM FLUID 44X44 (DRAPE) ×4 IMPLANT
DRSG OPSITE POSTOP 4X10 (GAUZE/BANDAGES/DRESSINGS) ×4 IMPLANT
DRSG OPSITE POSTOP 4X12 (GAUZE/BANDAGES/DRESSINGS) ×4 IMPLANT
DRSG OPSITE POSTOP 4X6 (GAUZE/BANDAGES/DRESSINGS) IMPLANT
DRSG OPSITE POSTOP 4X8 (GAUZE/BANDAGES/DRESSINGS) IMPLANT
ELECT REM PT RETURN 15FT ADLT (MISCELLANEOUS) ×4 IMPLANT
GAUZE 4X4 16PLY RFD (DISPOSABLE) IMPLANT
GLOVE BIO SURGEON STRL SZ 6 (GLOVE) ×12 IMPLANT
GLOVE BIO SURGEON STRL SZ 6.5 (GLOVE) ×12 IMPLANT
GLOVE BIOGEL PI IND STRL 7.5 (GLOVE) ×3 IMPLANT
GLOVE BIOGEL PI INDICATOR 7.5 (GLOVE) ×1
GOWN STRL REUS W/ TWL LRG LVL3 (GOWN DISPOSABLE) ×18 IMPLANT
GOWN STRL REUS W/TWL LRG LVL3 (GOWN DISPOSABLE) ×6
HEMOSTAT ARISTA ABSORB 3G PWDR (MISCELLANEOUS) IMPLANT
KIT BASIN OR (CUSTOM PROCEDURE TRAY) ×4 IMPLANT
LIGASURE IMPACT 36 18CM CVD LR (INSTRUMENTS) ×4 IMPLANT
LOOP VESSEL MAXI BLUE (MISCELLANEOUS) IMPLANT
NEEDLE HYPO 22GX1.5 SAFETY (NEEDLE) ×8 IMPLANT
NS IRRIG 1000ML POUR BTL (IV SOLUTION) ×8 IMPLANT
PACK GENERAL/GYN (CUSTOM PROCEDURE TRAY) ×4 IMPLANT
RELOAD PROXIMATE 75MM BLUE (ENDOMECHANICALS) ×8 IMPLANT
RELOAD PROXIMATE TA60MM BLUE (ENDOMECHANICALS) IMPLANT
RETRACTOR WND ALEXIS 25 LRG (MISCELLANEOUS) IMPLANT
RTRCTR WOUND ALEXIS 18CM MED (MISCELLANEOUS)
RTRCTR WOUND ALEXIS 25CM LRG (MISCELLANEOUS)
SHEET LAVH (DRAPES) ×4 IMPLANT
SPOGE SURGIFLO 8M (HEMOSTASIS)
SPONGE LAP 18X18 RF (DISPOSABLE) ×20 IMPLANT
SPONGE SURGIFLO 8M (HEMOSTASIS) IMPLANT
STAPLER GUN LINEAR PROX 60 (STAPLE) ×4 IMPLANT
STAPLER PROXIMATE 75MM BLUE (STAPLE) ×4 IMPLANT
STAPLER VISISTAT 35W (STAPLE) IMPLANT
SUT MNCRL AB 4-0 PS2 18 (SUTURE) ×8 IMPLANT
SUT MON AB 2-0 SH 27 (SUTURE) ×1
SUT MON AB 2-0 SH27 (SUTURE) ×3 IMPLANT
SUT PDS AB 1 TP1 96 (SUTURE) ×8 IMPLANT
SUT SILK 2 0 SH CR/8 (SUTURE) ×4 IMPLANT
SUT SILK 3 0 SH CR/8 (SUTURE) IMPLANT
SUT VIC AB 0 CT1 36 (SUTURE) ×16 IMPLANT
SUT VIC AB 2-0 CT1 36 (SUTURE) ×8 IMPLANT
SUT VIC AB 2-0 CT2 27 (SUTURE) ×24 IMPLANT
SUT VIC AB 2-0 SH 27 (SUTURE)
SUT VIC AB 2-0 SH 27X BRD (SUTURE) IMPLANT
SUT VIC AB 3-0 CTX 36 (SUTURE) ×8 IMPLANT
SUT VIC AB 3-0 SH 18 (SUTURE) ×4 IMPLANT
SUT VIC AB 3-0 SH 27 (SUTURE) ×6
SUT VIC AB 3-0 SH 27X BRD (SUTURE) ×12 IMPLANT
SUT VIC AB 3-0 SH 27XBRD (SUTURE) ×6 IMPLANT
SYR 30ML LL (SYRINGE) ×8 IMPLANT
TOWEL OR 17X26 10 PK STRL BLUE (TOWEL DISPOSABLE) ×4 IMPLANT
TOWEL OR NON WOVEN STRL DISP B (DISPOSABLE) ×4 IMPLANT
TRAY FOLEY MTR SLVR 16FR STAT (SET/KITS/TRAYS/PACK) ×4 IMPLANT
TUBING CONNECTING 10 (TUBING) ×4 IMPLANT
UNDERPAD 30X30 (UNDERPADS AND DIAPERS) ×4 IMPLANT
YANKAUER SUCT BULB TIP 10FT TU (MISCELLANEOUS) ×8 IMPLANT

## 2017-08-09 NOTE — Anesthesia Postprocedure Evaluation (Signed)
Anesthesia Post Note  Patient: Lisa Cline  Procedure(s) Performed: EXPLORATORY LAPAROTOMY (N/A ) EXCISION OF PELVIC MASS (N/A ) RIGHT OVARIAN CYSTECTOMY (Right ) POSSIBLE MYOMECTOMY (N/A ) COLON RESECTION SIGMOID W SIDE TO SIDE ANASTOMOSIS     Patient location during evaluation: PACU Anesthesia Type: General Level of consciousness: awake and alert Pain management: pain level controlled Vital Signs Assessment: post-procedure vital signs reviewed and stable Respiratory status: spontaneous breathing, nonlabored ventilation, respiratory function stable and patient connected to nasal cannula oxygen Cardiovascular status: blood pressure returned to baseline and stable Postop Assessment: no apparent nausea or vomiting Anesthetic complications: no    Last Vitals:  Vitals:   08/09/17 1407 08/09/17 1500  BP: 123/80 122/76  Pulse: 71 68  Resp: 12 12  Temp: 36.8 C 37 C  SpO2: 100% 100%    Last Pain:  Vitals:   08/09/17 1500  TempSrc: Oral  PainSc:                  Nikos Anglemyer COKER

## 2017-08-09 NOTE — Anesthesia Preprocedure Evaluation (Addendum)
Anesthesia Evaluation  Patient identified by MRN, date of birth, ID band Patient awake    Reviewed: Allergy & Precautions, NPO status , Patient's Chart, lab work & pertinent test results  Airway Mallampati: II  TM Distance: >3 FB Neck ROM: Full    Dental  (+) Teeth Intact, Dental Advisory Given   Pulmonary    breath sounds clear to auscultation       Cardiovascular  Rhythm:Regular Rate:Normal     Neuro/Psych    GI/Hepatic   Endo/Other    Renal/GU      Musculoskeletal   Abdominal   Peds  Hematology   Anesthesia Other Findings   Reproductive/Obstetrics                             Anesthesia Physical Anesthesia Plan  ASA: II  Anesthesia Plan: General   Post-op Pain Management:    Induction: Intravenous  PONV Risk Score and Plan: 1 and Ondansetron and Dexamethasone  Airway Management Planned: Oral ETT  Additional Equipment:   Intra-op Plan:   Post-operative Plan: Extubation in OR  Informed Consent: I have reviewed the patients History and Physical, chart, labs and discussed the procedure including the risks, benefits and alternatives for the proposed anesthesia with the patient or authorized representative who has indicated his/her understanding and acceptance.     Dental advisory given  Plan Discussed with: CRNA and Anesthesiologist  Anesthesia Plan Comments:         Anesthesia Quick Evaluation  

## 2017-08-09 NOTE — Op Note (Signed)
OPERATIVE NOTE 08/09/17  Preoperative Diagnosis: pelvic mass, right ovarian cyst   Postoperative Diagnosis: ventral abdominal wall mass, uterine subserosal fibroid, right ovarian cyst.    Procedure(s) Performed: Exploratory laparotomy resection of abdominal wall mass, partial resection of rectus muscle, sigmoid colectomy with side to side stapled anastamosis, subserosal myomectomy, right ovarian cystectomy.   Surgeon: Thereasa Solo, MD.  Assistant Surgeon: Lahoma Crocker, M.D. Assistant: (an MD assistant was necessary for tissue manipulation, retraction and positioning due to the complexity of the case and hospital policies).   Specimens: abdominal wall mass with sigmoid colon, right ovarian cyst, fundal fibroid, right and left rectus muscle   Estimated Blood Loss: 900 mL.  (100cc cell saver blood re-infused).  3800cc crystalloid 500cc albumin  Urine Output: 498 cc  Complications: None.   Operative Findings:  30cm solid vascular mass densely adherent to/infiltrating into/arising from the inferior rectus muscle bellies. Densely adherent to dome of bladder (peritoneal surface) but not infiltrating into detrusor. Densely adherent to 10cm segment of sigmoid colon at the level of the pelvic brim. The mass was not adherent to the uterus or right ovary.  The left ovary was surgically absent.  The uterus contained a 3 cm subserosal fundal fibroid which was separate to the larger mass.  The right ovary contained a 7 cm cystic mass which was also separate to the largest solid mass.  The appendix, cecum, transverse colon, descending colon, small intestines were all normal palpably and visibly.  The diaphragm was smooth as was the liver edge.  The spleen was palpably normal.  There is a small volume of ascitic fluid (less than 100 cc).  At the completion of the surgery there was no visible or palpable residual tumor remaining.  Procedure:   The patient was seen in the Holding Room. The risks,  benefits, complications, treatment options, and expected outcomes were discussed with the patient.  The patient concurred with the proposed plan, giving informed consent.   The patient was  identified as Lisa Cline and the procedure verified as resection of a pelvic mass, with possible myomectomy. A Time Out was held and the above information confirmed upon entry to the operating room..  After induction of anesthesia, the patient was draped and prepped in the usual sterile manner.  She was prepped and draped in the normal sterile fashion in the dorsal lithotomy position in padded Allen stirrups with good attention paid to support of the lower back and lower extremities. Position was adjusted for appropriate support. A Foley catheter was placed to gravity.  The Cell Saver device and perfusionist were available with the anticipation that such a large solid mass would involve significant blood loss during its removal.  The patient had large-bore IV placement, and was typed and crossed for 2 units which were present in the operating room.   A right paramedian incision was made and carried through the subcutaneous tissue to the fascia. The fascial incision was made and extended superiorally. The rectus muscles were separated in the upper abdomen immediately above the level of the umbilicus.  The peritoneum was identified and entered. Peritoneal incision was extended longitudinally from superior to inferior.  During opening of the peritoneum it was apparent that the large solid, smooth abdominal mass was densely adherent to the entire anterior lower abdominal wall and infiltrating through the rectus muscle and adherent to the dome of the bladder.  Hand was swept behind the mass and it was apparent that it was not originating from the uterus  or contralateral ovary.  Using Bovie dissection the mass was carefully dissected from the lower anterior abdominal wall fascia and rectus muscle bellies.  Significant  bleeding was encountered during this dissection as this was a highly vascular and broad connection without a capsular tissue plane to adhere to.  Approximately 900 cc of blood loss occurred over 30-minute.,  And the perfusionist was summons to the operating room and began reinfusing lost blood.  A total of 100 cc of blood was reinfused into the patient from what was reclaimed from the Cell Saver.   The abdominal incision was extended superiorly which facilitated delivery of the mass through the incision at its upper level and reflection inferiorly.  This enabled [place] to visualize the posterior surface of the mass which was apparently adherent to the sigmoid colon at the level of the pelvic brim.  This was a mobile segment of sigmoid colon.  There was no plane between the colon and the mass that was visible it was concerning that the mass was originating from the colonic wall.  A decision was made to proceed with a sigmoid colon resection in order to remove the mass.  The patient received the appropriate antibiotic coverage to extend to cover colonic flora.  A hemostat was used with the Bovie to create a window between the sigmoid colon bowel wall and the mesentery.  A 75 mm GIA stapler was placed through this window.  The stapler was fired at the proximal aspect.  A similar procedure was performed to staple across the inferior limb of the sigmoid colon approximately 5 cm from its adherence to the mass.  The LigaSure bipolar sealing and cutting device was then used to separate the mesenteric attachments to the mass.  Attention was again turned to the inferior aspect of attachments to the anterior abdominal wall and bladder dome.  Using the Bovie and careful dissection the mass was separated from its attachments to the rectus bellies, pyramid Allis muscles, and bladder dome.  There was no entry into the bladder during the dissection.  When the tumor had been completely liberated from its attachments it was handed  off the surgical field for frozen section.  The frozen section later returned as a myxoid tumor without clear malignancy but suspicion for sarcoma.  It did not appear consistent with a leiomyosarcoma.  Hemostasis was achieved with the electrosurgical device at the rectus bellies.  Hemostasis was achieved at the dome of the bladder using 3-0 Vicryl to oversew areas of oozing.  The bladder was very carefully inspected and no breach of the detrusor muscle mucosa was appreciated.  The Foley catheter drainage was inspected and this was clear yellow with no evidence of blood.  A Bookwalter retractor was then placed. A survey of the abdomen and pelvis revealed the above findings. The pelvic viscera were then able to be closely inspected.  The uterine fundus contained a 3 to 4 cm fundal subserosal fibroid.  Using the Bovie the serosa was incised and careful dissection with the Bovie separated the subserosal fibroid from its attachments to the underlying myometrium.  This was sent as a separate specimen for permanent pathology.  Using a baseball imbricating stitch the serosa and underlying myometrium was closed with a 2-0 Monocryl suture.  The right ovary was then carefully inspected and noted to contain an 8 cm cyst which was known from preoperative imaging.  It appeared benign.  On imaging was most consistent with a dermoid.  The Bovie was used to  incise the ovarian capsule at the margin of what appeared to be normal ovary and the ovarian cyst.  Careful dissection liberated the cyst intact from its surrounding ovarian capsule and ovarian stroma.  It was handed off the field for permanent pathology.  The ovarian capsule was then closely inspected and hemostasis was achieved with judicious bursts of monopolar cautery.  Prior to re-anastomosis of the sigmoid colon the left pelvic sidewall was opened up to visualize the left ureter which was running in the retroperitoneum and free and distant from any sites of surgical  dissection.  It was not dilated.  The afferent different limbs of the sigmoid colon was then mobilized by opening up the retroperitoneum such that they were not on tension when placed in the side to side orientation for side to side primary stapled anastomosis.  The corners of the bowel ends were opened. The GIA stapler was passed into these limbs, deployed and fired to create a stapled functional side-to-side anastamosis. The opening of the two limbs was reapproximated with allis clamps. A 28mm TA stapler was used to seal the top of the anastamosis with a double layer of staples.  There was some oozing from what appeared to be highly vascular anastomosis (which was reassuring).  This was made hemostatic with figure-of-eight 3-0 Vicryl sutures.  These imbricating sutures also oversewed the anastomosis.  The anastomosis was palpated to ensure that it was widely patent and it was.  The mesenteric window was closed with 2-0 silk interrupted.  The muscle bellies of the rectus muscle was then carefully palpated.  There was residual induration appreciated at the inferior rectus muscle bellies bilaterally.  Given the concern that this was residual tumor, the Bovie was utilized to carefully dissected all palpable indurated muscle in the inferior aspects of the rectus muscles.  These were sent as separate left and right rectus muscle bellies and oriented.  Radiopaque surgical clips were utilized to mark the boundaries of the surgical dissection of the rectus bellies and peritoneum anteriorly.  Hemostasis was achieved with 3-0 Vicryl suture in figure-of-eight's in these rectus bellies at bleeding vessels.  Copious irrigation of the peritoneal cavity took place.  No active bleeding was appreciated.  No visible or palpable residual tumor was present at the completion of the procedure.   In accordance with the colonic surgery bundle the surgeon and assistant broke scrub changed gowns and gloves as at the surgical tech.  A  fresh clean tray of closing instrumentation was used.  The patient was freshly draped with clean drapes and new suction, Bovie and all equipment that handled the patient will utilize at this point in time. The fascia was reapproximated with #1 looped PDS using a total of two sutures.  There was care to ensure closure in the inferior aspect of the fascia took place.  There is a reasonable amount of tension in the lower aspect of the abdomen despite removing some of the fascia during the resection of the pelvic mass.  This was likely possible because the fascia had been significantly stretch from such a large abdominopelvic mass preoperatively.  The subcutaneous layer was then irrigated copiously.  Exparel long acting local anesthetic was infiltrated into the subcutaneous tissues. Subcutateous fat was closed with suture. The skin was closed with subcuticular suture. The patient tolerated the procedure well.   Sponge, lap and needle counts were correct x 2.   Thereasa Solo, MD

## 2017-08-09 NOTE — Interval H&P Note (Signed)
History and Physical Interval Note:  08/09/2017 7:10 AM  Lisa Cline  has presented today for surgery, with the diagnosis of PELVIC AND ABDOMINAL MASS  The various methods of treatment have been discussed with the patient and family. After consideration of risks, benefits and other options for treatment, the patient has consented to  Procedure(s): EXPLORATORY LAPAROTOMY (N/A) EXCISION OF PELVIC MASS (N/A) RIGHT OVARIAN CYSTECTOMY (Right) POSSIBLE MYOMECTOMY (N/A) as a surgical intervention .  The patient's history has been reviewed, patient examined, no change in status, stable for surgery.  I have reviewed the patient's chart and labs. Preop MRI suggests that this mass is most likely an ovarian mass.  Questions were answered to the patient's satisfaction.     Thereasa Solo

## 2017-08-09 NOTE — Transfer of Care (Signed)
Immediate Anesthesia Transfer of Care Note  Patient: Lisa Cline  Procedure(s) Performed: Procedure(s): EXPLORATORY LAPAROTOMY (N/A) EXCISION OF PELVIC MASS (N/A) RIGHT OVARIAN CYSTECTOMY (Right) POSSIBLE MYOMECTOMY (N/A) COLON RESECTION SIGMOID W SIDE TO SIDE ANASTOMOSIS  Patient Location: PACU  Anesthesia Type:General  Level of Consciousness:  sedated, patient cooperative and responds to stimulation  Airway & Oxygen Therapy:Patient Spontanous Breathing and Patient connected to face mask oxgen  Post-op Assessment:  Report given to PACU RN and Post -op Vital signs reviewed and stable  Post vital signs:  Reviewed and stable  Last Vitals:  Vitals:   08/09/17 0558 08/09/17 1119  BP: (!) 140/106 (!) (P) 116/91  Pulse: 93 (P) 77  Resp:  (P) 12  Temp: 36.9 C (!) (P) 36.4 C  SpO2: 99% (P) 199%    Complications: No apparent anesthesia complications

## 2017-08-09 NOTE — Anesthesia Procedure Notes (Signed)
Procedure Name: Intubation Date/Time: 08/09/2017 7:52 AM Performed by: Anne Fu, CRNA Pre-anesthesia Checklist: Patient identified, Emergency Drugs available, Suction available, Patient being monitored and Timeout performed Patient Re-evaluated:Patient Re-evaluated prior to induction Oxygen Delivery Method: Circle system utilized Preoxygenation: Pre-oxygenation with 100% oxygen Induction Type: IV induction Ventilation: Mask ventilation without difficulty Laryngoscope Size: Mac and 4 Grade View: Grade I Tube type: Oral Tube size: 7.5 mm Number of attempts: 1 Airway Equipment and Method: Stylet Placement Confirmation: ETT inserted through vocal cords under direct vision,  positive ETCO2 and breath sounds checked- equal and bilateral Secured at: 21 cm Tube secured with: Tape Dental Injury: Teeth and Oropharynx as per pre-operative assessment

## 2017-08-09 NOTE — Addendum Note (Signed)
Addendum  created 08/09/17 1551 by Anne Fu, CRNA   Intraprocedure Flowsheets edited

## 2017-08-10 ENCOUNTER — Encounter (HOSPITAL_COMMUNITY): Payer: Self-pay | Admitting: Gynecologic Oncology

## 2017-08-10 LAB — BASIC METABOLIC PANEL
ANION GAP: 4 — AB (ref 5–15)
BUN: 8 mg/dL (ref 6–20)
CO2: 27 mmol/L (ref 22–32)
Calcium: 8 mg/dL — ABNORMAL LOW (ref 8.9–10.3)
Chloride: 108 mmol/L (ref 101–111)
Creatinine, Ser: 0.66 mg/dL (ref 0.44–1.00)
Glucose, Bld: 121 mg/dL — ABNORMAL HIGH (ref 65–99)
POTASSIUM: 4.6 mmol/L (ref 3.5–5.1)
SODIUM: 139 mmol/L (ref 135–145)

## 2017-08-10 LAB — CBC
HCT: 26 % — ABNORMAL LOW (ref 36.0–46.0)
Hemoglobin: 8.9 g/dL — ABNORMAL LOW (ref 12.0–15.0)
MCH: 31.9 pg (ref 26.0–34.0)
MCHC: 34.2 g/dL (ref 30.0–36.0)
MCV: 93.2 fL (ref 78.0–100.0)
PLATELETS: 252 10*3/uL (ref 150–400)
RBC: 2.79 MIL/uL — AB (ref 3.87–5.11)
RDW: 12.7 % (ref 11.5–15.5)
WBC: 12.7 10*3/uL — AB (ref 4.0–10.5)

## 2017-08-10 MED ORDER — SIMETHICONE 80 MG PO CHEW
80.0000 mg | CHEWABLE_TABLET | Freq: Four times a day (QID) | ORAL | Status: DC | PRN
  Administered 2017-08-10: 80 mg via ORAL
  Filled 2017-08-10: qty 1

## 2017-08-10 MED ORDER — FERROUS GLUCONATE 324 (38 FE) MG PO TABS
324.0000 mg | ORAL_TABLET | Freq: Two times a day (BID) | ORAL | Status: DC
  Administered 2017-08-10 – 2017-08-11 (×4): 324 mg via ORAL
  Filled 2017-08-10 (×7): qty 1

## 2017-08-10 NOTE — Progress Notes (Signed)
1 Day Post-Op Procedure(s) (LRB): EXPLORATORY LAPAROTOMY (N/A) EXCISION OF PELVIC MASS (N/A) RIGHT OVARIAN CYSTECTOMY (Right) POSSIBLE MYOMECTOMY (N/A) COLON RESECTION SIGMOID W SIDE TO SIDE ANASTOMOSIS  Subjective: Patient reports minimal pain, no flatus, no complaints. Tolerating PO, not yet voided.    Objective: Vital signs in last 24 hours: Temp:  [97.5 F (36.4 C)-98.6 F (37 C)] 98.2 F (36.8 C) (06/19 0559) Pulse Rate:  [63-95] 80 (06/19 0559) Resp:  [9-25] 17 (06/19 0559) BP: (110-143)/(68-91) 110/70 (06/19 0559) SpO2:  [100 %] 100 % (06/19 0559) Last BM Date: 08/07/17  Intake/Output from previous day: 06/18 0701 - 06/19 0700 In: 5855 [P.O.:830; I.V.:4225; Blood:100; IV Piggyback:700] Out: 3225 [Urine:2125; Blood:1100]  Physical Examination: General: alert and cooperative GI: soft, non-tender; bowel sounds normal; no masses,  no organomegaly and incision: clean, dry, intact and with impervious dressing and abdominal binder Extremities: extremities normal, atraumatic, no cyanosis or edema  Labs: WBC/Hgb/Hct/Plts:  12.7/8.9/26.0/252 (06/19 0451) BUN/Cr/glu/ALT/AST/amyl/lip:  8/0.66/--/--/--/--/-- (06/19 0451)   Assessment:  32 y.o. s/p Procedure(s): EXPLORATORY LAPAROTOMY EXCISION OF PELVIC MASS RIGHT OVARIAN CYSTECTOMY POSSIBLE MYOMECTOMY COLON RESECTION SIGMOID W SIDE TO SIDE ANASTOMOSIS: stable Pain:  Pain is well-controlled on oral medications.  Heme:Anemia: acute post op blood loss anemia  - expected Hb after equilabration without signs of ongoing losses. Will start iron replacement. Will repeat CBC in am.  ID: mild elevation in WBC consistent with postop status, afebrile  CV: hemodynamically stable.  GI:  Tolerating po: Yes   S/p sigmoid colon resection and side to side anastamosis. Awaiting return of bowel function.   FEN: hep lock IVF today.  GU: normal renal function, appropriate UO. S/p removal of foley.  Prophylaxis: pharmacologic  prophylaxis (with any of the following: enoxaparin (Lovenox) 40mg  SQ 2 hours prior to surgery then every day).  Plan: Advance diet Encourage ambulation Discontinue IV fluids teach lovenox administration in case necessary for discharge (if malignancy found on pathology). Dispo:  Discharge plan to include :consults: @CM @, Social Work The patient is to be discharged to home likely POD 2 vs 3.   LOS: 1 day    Thereasa Solo 08/10/2017, 7:29 AM

## 2017-08-11 LAB — CBC WITH DIFFERENTIAL/PLATELET
BASOS ABS: 0 10*3/uL (ref 0.0–0.1)
BASOS PCT: 0 %
Eosinophils Absolute: 0 10*3/uL (ref 0.0–0.7)
Eosinophils Relative: 1 %
HEMATOCRIT: 22.1 % — AB (ref 36.0–46.0)
HEMOGLOBIN: 7.6 g/dL — AB (ref 12.0–15.0)
LYMPHS PCT: 28 %
Lymphs Abs: 2 10*3/uL (ref 0.7–4.0)
MCH: 32.5 pg (ref 26.0–34.0)
MCHC: 34.4 g/dL (ref 30.0–36.0)
MCV: 94.4 fL (ref 78.0–100.0)
Monocytes Absolute: 0.7 10*3/uL (ref 0.1–1.0)
Monocytes Relative: 10 %
NEUTROS ABS: 4.5 10*3/uL (ref 1.7–7.7)
NEUTROS PCT: 61 %
Platelets: 205 10*3/uL (ref 150–400)
RBC: 2.34 MIL/uL — AB (ref 3.87–5.11)
RDW: 13.3 % (ref 11.5–15.5)
WBC: 7.4 10*3/uL (ref 4.0–10.5)

## 2017-08-11 LAB — HEMOGLOBIN AND HEMATOCRIT, BLOOD
HEMATOCRIT: 23.3 % — AB (ref 36.0–46.0)
Hemoglobin: 8 g/dL — ABNORMAL LOW (ref 12.0–15.0)

## 2017-08-11 MED ORDER — ACETAMINOPHEN 500 MG PO TABS: 1000.0000 mg | ORAL_TABLET | Freq: Four times a day (QID) | ORAL | 0 refills | Status: DC | PRN

## 2017-08-11 MED ORDER — ENOXAPARIN SODIUM 40 MG/0.4ML ~~LOC~~ SOLN: 40.0000 mg | SUBCUTANEOUS | 0 refills | Status: DC

## 2017-08-11 MED ORDER — FERROUS GLUCONATE 324 (38 FE) MG PO TABS: 324.0000 mg | ORAL_TABLET | Freq: Two times a day (BID) | ORAL | 1 refills | Status: DC

## 2017-08-11 MED ORDER — IBUPROFEN 600 MG PO TABS: 600.0000 mg | ORAL_TABLET | Freq: Four times a day (QID) | ORAL | 0 refills | Status: DC | PRN

## 2017-08-11 MED ORDER — SENNOSIDES-DOCUSATE SODIUM 8.6-50 MG PO TABS: 2.0000 | ORAL_TABLET | Freq: Every day | ORAL | 0 refills | Status: DC

## 2017-08-11 NOTE — Progress Notes (Addendum)
2 Days Post-Op Procedure(s) (LRB): EXPLORATORY LAPAROTOMY (N/A) EXCISION OF PELVIC MASS (N/A) RIGHT OVARIAN CYSTECTOMY (Right) POSSIBLE MYOMECTOMY (N/A) COLON RESECTION SIGMOID W SIDE TO SIDE ANASTOMOSIS  Subjective: Patient reports moderate abdominal cramping which she relates to senna use.  She has passed flatus three times today.  Ambulating without difficulty.  No dizziness or lightheadedness reported.  Denies chest pain, dyspnea. States she feels she is at the end of her menstrual cycle and her bleeding is not heavy.  She is afraid to have a bowel movement.  Wanting to know what the abdominal mass looked like.  No other concerns voiced. Fiance at the bedside.    Objective: Vital signs in last 24 hours: Temp:  [98.4 F (36.9 C)-99.3 F (37.4 C)] 98.7 F (37.1 C) (06/20 0611) Pulse Rate:  [84-98] 86 (06/20 0611) Resp:  [16-18] 16 (06/20 0611) BP: (103-133)/(71-78) 125/78 (06/20 0611) SpO2:  [100 %] 100 % (06/20 0611) Last BM Date: 08/08/17  Intake/Output from previous day: 06/19 0701 - 06/20 0700 In: 39 [P.O.:970] Out: 400 [Urine:400]  Physical Examination: General: alert, cooperative and no distress Resp: clear to auscultation bilaterally Cardio: regular rate and rhythm, S1, S2 normal, no murmur, click, rub or gallop GI: incision: op site dressing in place with no drainage noted and abdomen soft, non-tympanic, active bowel sounds, binder in place Extremities: extremities normal, atraumatic, no cyanosis or edema  Labs: WBC/Hgb/Hct/Plts:  7.4/7.6/22.1/205 (06/20 0438)    Assessment: 32 y.o. s/p Procedure(s): EXPLORATORY LAPAROTOMY EXCISION OF PELVIC MASS RIGHT OVARIAN CYSTECTOMY POSSIBLE MYOMECTOMY COLON RESECTION SIGMOID W SIDE TO SIDE ANASTOMOSIS: stable Pain:  Pain is well-controlled on PRN medications.  Heme: Anemia related to acute post-op blood loss. Hgb 7.6 and Hct 22.1 this am.  Plan for repeat H&H at noon.  Lovenox on hold but patient received dose at  8am.  ID: mild elevation in WBC consistent with postop status, afebrile  CV: BP and HR stable post-operatively.  Continue to monitor with routine vital signs.  GI:  Tolerating po: Yes.  Passing flatus.  Scop patch in place.  Antiemetics ordered PRN but no nausea/emesis reported. S/p sigmoid colon resection and side to side anastamosis.  GU: Voiding.  Adequate amounts reported.    FEN: Stable on post-op Bmet.  IV saline locked.  Prophylaxis: SCDs and lovenox (on hold pending repeat H&H).  Plan: Repeat H&H at noon [place] for symptom relief of abdominal discomfort Encourage ambulation, IS use, deep breathing, and coughing Continue plan of care   LOS: 2 days    Dorothyann Gibbs 08/11/2017, 10:35 AM   Patient seen and examined by me. Agree with above.

## 2017-08-11 NOTE — Discharge Summary (Signed)
Physician Discharge Summary  Patient ID: Lisa Cline MRN: [id number] DOB/AGE: 10/06/1985 32 y.o.  Admit date: 08/09/2017 Discharge date: 08/11/2017  Admission Diagnoses: Pelvic mass  Discharge Diagnoses:  Principal Problem:   Pelvic mass Active Problems:   Pelvic mass in female   Discharged Condition:  The patient is in good condition and stable for discharge.    Hospital Course: On 08/09/2017, the patient underwent the following: Procedure(s): EXPLORATORY LAPAROTOMY EXCISION OF PELVIC MASS RIGHT OVARIAN CYSTECTOMY POSSIBLE MYOMECTOMY COLON RESECTION SIGMOID W SIDE TO SIDE ANASTOMOSIS.  The postoperative course was uneventful.  She was discharged to home on postoperative day 2 tolerating a regular diet, having bowel movements, passing flatus, ambulating, voiding, pain controlled with oral medications.  Consults: None  Significant Diagnostic Studies: None  Treatments: surgery: see above  Discharge Exam: Blood pressure 126/72, pulse 85, temperature 99 F (37.2 C), temperature source Oral, resp. rate 17, height 5\' 10"  (1.778 m), weight 216 lb (98 kg), last menstrual period 08/08/2017, SpO2 100 %. General appearance: alert, cooperative and no distress Resp: clear to auscultation bilaterally Cardio: regular rate and rhythm, S1, S2 normal, no murmur, click, rub or gallop GI: soft, non-tender; bowel sounds normal; no masses,  no organomegaly Extremities: extremities normal, atraumatic, no cyanosis or edema Incision/Wound: Midline incision with honeycomb dressing in place, no drainage present  Disposition: Discharge disposition: 01-Home or Self Care       Discharge Instructions    Call MD for:  difficulty breathing, headache or visual disturbances   Complete by:  As directed    Call MD for:  extreme fatigue   Complete by:  As directed    Call MD for:  hives   Complete by:  As directed    Call MD for:  persistant dizziness or light-headedness   Complete by:  As  directed    Call MD for:  persistant nausea and vomiting   Complete by:  As directed    Call MD for:  redness, tenderness, or signs of infection (pain, swelling, redness, odor or green/yellow discharge around incision site)   Complete by:  As directed    Call MD for:  severe uncontrolled pain   Complete by:  As directed    Call MD for:  temperature >100.4   Complete by:  As directed    Diet - low sodium heart healthy   Complete by:  As directed    Driving Restrictions   Complete by:  As directed    No driving for 2 weeks from surgery.  Do not take narcotics and drive.   Increase activity slowly   Complete by:  As directed    Lifting restrictions   Complete by:  As directed    No lifting greater than 10 lbs.   Sexual Activity Restrictions   Complete by:  As directed    No sexual activity, nothing in the vagina, for 4-6 weeks.     Allergies as of 08/11/2017   No Known Allergies     Medication List    TAKE these medications   acetaminophen 500 MG tablet Commonly known as:  TYLENOL Take 2 tablets (1,000 mg total) by mouth every 6 (six) hours as needed for mild pain.   enoxaparin 40 MG/0.4ML injection Commonly known as:  LOVENOX Inject 0.4 mLs (40 mg total) into the skin daily for 26 doses.   ferrous gluconate 324 MG tablet Commonly known as:  FERGON Take 1 tablet (324 mg total) by mouth 2 (two) times daily  with a meal.   ibuprofen 600 MG tablet Commonly known as:  ADVIL,MOTRIN Take 1 tablet (600 mg total) by mouth every 6 (six) hours as needed for moderate pain. What changed:    medication strength  how much to take   oxyCODONE 5 MG immediate release tablet Commonly known as:  Oxy IR/ROXICODONE Take 1 tablet (5 mg total) by mouth at bedtime as needed for severe pain (Do not take with tramadol, use at bedtime, do not take and drive).   senna-docusate 8.6-50 MG tablet Commonly known as:  Senokot-S Take 2 tablets by mouth at bedtime.   traMADol 50 MG  tablet Commonly known as:  ULTRAM Take 1 tablet (50 mg total) by mouth every 6 (six) hours as needed for moderate pain (Do not take with oxycodone, do not take and drive).      Follow-up Information    Everitt Amber, MD Follow up on 08/31/2017.   Specialty:  Gynecologic Oncology Why:  at 11:45am at the [facility] information: [address] [place] [address] [phone]           Greater than thirty minutes were spend for face to face discharge instructions and discharge orders/summary in EPIC.   Signed: Dorothyann Gibbs 08/11/2017, 4:53 PM

## 2017-08-11 NOTE — Progress Notes (Signed)
Pt given discharge instructions and all questions were answered.

## 2017-08-11 NOTE — Discharge Instructions (Signed)
08/11/2017  Return to work: 6-8 weeks if applicable  Activity: 1. Be up and out of the bed during the day.  Take a nap if needed.  You may walk up steps but be careful and use the hand rail.  Stair climbing will tire you more than you think, you may need to stop part way and rest.   2. No lifting or straining for 6 weeks.  3. No driving for 2 week(s).  Do not drive if you are taking narcotic pain medicine.  4. Shower daily.  Use soap and water on your incision and pat dry; don't rub.  No tub baths until cleared by your surgeon.   5. No sexual activity and nothing in the vagina for 4-6 weeks.  6. You may experience a small amount of clear drainage from your incisions, which is normal.  If the drainage persists or increases, please call the office.  7. You may experience vaginal spotting after surgery or around the 6-8 week mark from surgery when the stitches at the top of the vagina begin to dissolve.  The spotting is normal but if you experience heavy bleeding, call our office.  8. Take Tylenol or ibuprofen first for pain and only use Percocet for severe pain not relieved by the Tylenol or Ibuprofen.  Monitor your Tylenol intake to a max of 4,000 mg a day since Percocet has Tylenol in it as well.  Diet: 1. Low sodium Heart Healthy Diet is recommended.  2. It is safe to use a laxative, such as Miralax or Colace, if you have difficulty moving your bowels. You can take Sennakot at bedtime every evening to keep bowel movements regular and to prevent constipation.    Wound Care: 1. Keep clean and dry.  Shower daily.  Reasons to call the Doctor:  Fever - Oral temperature greater than 100.4 degrees Fahrenheit  Foul-smelling vaginal discharge  Difficulty urinating  Nausea and vomiting  Increased pain at the site of the incision that is unrelieved with pain medicine.  Difficulty breathing with or without chest pain  New calf pain especially if only on one side  Sudden, continuing  increased vaginal bleeding with or without clots.   Contacts: For questions or concerns you should contact:  Dr. Everitt Amber at [phone]  Joylene John, NP at [phone]  After Hours: call [phone] and have the GYN Oncologist paged/contacted   How and Where to Give Subcutaneous Enoxaparin Injections Enoxaparin is an injectable medicine. It is used to help prevent blood clots from developing in your veins. Health care providers often use anticoagulants like enoxaparin to prevent clots following surgery. Enoxaparin is also used in combination with other medicines to treat blood clots and heart attacks. If blood clots are left untreated, they can be life threatening. Enoxaparin comes in single-use syringes. You inject enoxaparin through a syringe into your belly (abdomen). You should change the injection site each time you give yourself a shot. Continue the enoxaparin injections as directed by your health care provider. Your health care provider will use blood clotting test results to decide when you can safely stop using enoxaparin injections. If your health care provider prescribes any additional medicines, use the medicines exactly as directed. How do I inject enoxaparin? 9. Wash your hands with soap and water. 10. Clean the selected injection site as directed by your health care provider. 11. Remove the needle cap by pulling it straight off the syringe. 12. When using a prefilled syringe, do not push the air  bubble out of the syringe before the injection. The air bubble will help you get all of the medicine out of the syringe. 13. Hold the syringe like a pencil using your writing hand. 14. Use your other hand to pinch and hold an inch of the cleansed skin. 15. Insert the entire needle straight down into the fold of skin. 16. Push the plunger with your thumb until the syringe is empty. 17. Pull the needle straight out of your skin. 18. Enoxaparin injection prefilled syringes and  graduated prefilled syringes are available with a system that shields the needle after injection. After you have completed your injection and removed the needle from your skin, firmly push down on the plunger. The protective sleeve will automatically cover the needle and you will hear a click. The click means the needle is safely covered. 19. Place the syringe in the nearest needle box, also called a sharps container. If you do not have a sharps container, you can use a hard-sided plastic container with a secure lid, such as an empty laundry detergent bottle. What else do I need to know?  Do not use enoxaparin if: ? You have allergies to heparin or pork products. ? You have been diagnosed with a condition called thrombocytopenia.  Do not use the syringe or needle more than one time.  Use medicines only as directed by your health care provider.  Changes in medicines, supplements, diet, and illness can affect your anticoagulation therapy. Be sure to inform your health care provider of any of these changes.  It is important that you tell all of your health care providers and your dentist that you are taking an anticoagulant, especially if you are injured or plan to have any type of procedure.  While on anticoagulants, you will need to have blood tests done routinely as directed by your health care provider.  While using this medicine, avoid physical activities or sports that could result in a fall or cause injury.  Follow up with your laboratory test and health care provider appointments as directed. It is very important to keep your appointments. Not keeping appointments could result in a chronic or permanent injury, pain, or disability.  Before giving your medicine, you should make sure the injection is a clear and colorless or pale yellow solution. If your medicine becomes discolored or if there are particles in the syringe, do not use it and notify your health care provider.  Keep your  medicine safely stored at room temperature. Contact a health care provider if:  You develop any rashes on your skin.  You have large areas of bruising on your skin.  You have any worsening of the condition for which you take Enoxaparin.  You develop a fever. Get help right away if:  You develop bleeding problems such as: ? Bleeding from the gums or nose that does not stop quickly. ? Vomiting blood or coughing up blood. ? Blood in your urine. ? Blood in your stool, or stool that has a dark, tarry, or coffee grounds appearance. ? A cut that does not stop bleeding within 10 minutes. These symptoms may represent a serious problem that is an emergency. Do not wait to see if the symptoms will go away. Get medical help right away. Call your local emergency services (911 in the [place] Do not drive yourself to the hospital. This information is not intended to replace advice given to you by your health care provider. Make sure you discuss any questions you have  with your health care provider. Document Released: 12/11/2003 Document Revised: 10/16/2015 Document Reviewed: 07/26/2013 Elsevier Interactive Patient Education  2018 [employer].

## 2017-08-13 LAB — BPAM RBC
BLOOD PRODUCT EXPIRATION DATE: 201907072359
Blood Product Expiration Date: 201907072359
ISSUE DATE / TIME: 201906180846
ISSUE DATE / TIME: 201906180846
UNIT TYPE AND RH: 5100
Unit Type and Rh: 5100

## 2017-08-13 LAB — TYPE AND SCREEN
ABO/RH(D): O POS
Antibody Screen: NEGATIVE
UNIT DIVISION: 0
Unit division: 0

## 2017-08-15 ENCOUNTER — Telehealth: Payer: Self-pay

## 2017-08-15 ENCOUNTER — Other Ambulatory Visit: Payer: Self-pay | Admitting: Gynecologic Oncology

## 2017-08-15 DIAGNOSIS — B373 Candidiasis of vulva and vagina: Secondary | ICD-10-CM

## 2017-08-15 DIAGNOSIS — B3731 Acute candidiasis of vulva and vagina: Secondary | ICD-10-CM

## 2017-08-15 MED ORDER — FLUCONAZOLE 150 MG PO TABS: 150.0000 mg | ORAL_TABLET | Freq: Once | ORAL | 0 refills | Status: AC

## 2017-08-15 NOTE — Telephone Encounter (Addendum)
Returned pt's call regarding she thinks she having vaginal discharge and itching, thinks it's a yeast infection but doesn't want to use monistat and wanted to see if could have prescription for something oral for yeast infection. Also when can she drive, one paper said one week, another said 2 weeks.  I told her I would let Joylene John NP know and then I would call her back. Pt voiced understanding.

## 2017-08-15 NOTE — Progress Notes (Signed)
See RN note. Patient called with symptoms of vulvo-vaginal candidiasis.

## 2017-08-15 NOTE — Telephone Encounter (Signed)
Returned pt's call regarding my last telephone note- per Joylene John NP - updated her pharmacy and she will send in prescription for Diflucan.  Updated to [facility] in [place] [address].  Also per Lenna Sciara NP 'driving is normally 2 weeks from surgery'. Pt also asked about why she has to take Lovenox, per Lenna Sciara NP - with larger incisions and possible cancer dx, can put her at risk for developing a blood clot, so Lovenox will help prevent it.  Pt voiced understanding. Reminded pt to let us know if Diflucan not helping or if any changes ie fever. Pt voiced undestanding.

## 2017-08-19 ENCOUNTER — Telehealth: Payer: Self-pay | Admitting: Gynecologic Oncology

## 2017-08-19 DIAGNOSIS — D481 Neoplasm of uncertain behavior of connective and other soft tissue: Secondary | ICD-10-CM

## 2017-08-19 NOTE — Telephone Encounter (Signed)
Informed patient of diagnosis of desmoid tumor originating in incision site (low transverse abdominal)/rectus muscle.  Given features of her case (young age, abdominal wall site, site of prior incision) she is at increased likelihood for having FAP/Gardner syndrome. Therefore we will have her seen by genetics counselor.  We will have her case presented at tumor board to formalize plan for surveillance (including time and appropriate imaging).   Thereasa Solo, MD

## 2017-08-22 ENCOUNTER — Telehealth: Payer: Self-pay | Admitting: Genetic Counselor

## 2017-08-22 ENCOUNTER — Encounter: Payer: Self-pay | Admitting: Genetic Counselor

## 2017-08-22 NOTE — Telephone Encounter (Signed)
Genetic counseling appointment scheduled for the pt to see Roma Kayser on  8/12 at 2pm. Letter mailed to the pt.

## 2017-08-31 ENCOUNTER — Inpatient Hospital Stay: Payer: 59 | Attending: Gynecologic Oncology | Admitting: Gynecologic Oncology

## 2017-08-31 ENCOUNTER — Encounter: Payer: Self-pay | Admitting: Gynecologic Oncology

## 2017-08-31 ENCOUNTER — Inpatient Hospital Stay: Payer: 59

## 2017-08-31 VITALS — BP 115/75 | HR 78 | Temp 98.0°F | Resp 20 | Ht 70.0 in | Wt 200.0 lb

## 2017-08-31 DIAGNOSIS — D509 Iron deficiency anemia, unspecified: Secondary | ICD-10-CM

## 2017-08-31 DIAGNOSIS — R1900 Intra-abdominal and pelvic swelling, mass and lump, unspecified site: Secondary | ICD-10-CM

## 2017-08-31 DIAGNOSIS — D250 Submucous leiomyoma of uterus: Secondary | ICD-10-CM | POA: Insufficient documentation

## 2017-08-31 DIAGNOSIS — D48113 Desmoid tumor of abdominal wall: Secondary | ICD-10-CM | POA: Insufficient documentation

## 2017-08-31 DIAGNOSIS — N83201 Unspecified ovarian cyst, right side: Secondary | ICD-10-CM | POA: Insufficient documentation

## 2017-08-31 DIAGNOSIS — D481 Neoplasm of uncertain behavior of connective and other soft tissue: Secondary | ICD-10-CM | POA: Diagnosis not present

## 2017-08-31 DIAGNOSIS — Z7189 Other specified counseling: Secondary | ICD-10-CM | POA: Diagnosis not present

## 2017-08-31 LAB — CBC WITH DIFFERENTIAL (CANCER CENTER ONLY)
BASOS ABS: 0 10*3/uL (ref 0.0–0.1)
BASOS PCT: 0 %
EOS ABS: 0.1 10*3/uL (ref 0.0–0.5)
EOS PCT: 2 %
HCT: 31.9 % — ABNORMAL LOW (ref 34.8–46.6)
Hemoglobin: 10.6 g/dL — ABNORMAL LOW (ref 11.6–15.9)
Lymphocytes Relative: 43 %
Lymphs Abs: 2.7 10*3/uL (ref 0.9–3.3)
MCH: 31.9 pg (ref 25.1–34.0)
MCHC: 33.2 g/dL (ref 31.5–36.0)
MCV: 96.1 fL (ref 79.5–101.0)
MONO ABS: 0.4 10*3/uL (ref 0.1–0.9)
Monocytes Relative: 7 %
Neutro Abs: 3 10*3/uL (ref 1.5–6.5)
Neutrophils Relative %: 48 %
PLATELETS: 331 10*3/uL (ref 145–400)
RBC: 3.32 MIL/uL — ABNORMAL LOW (ref 3.70–5.45)
RDW: 13.7 % (ref 11.2–14.5)
WBC: 6.2 10*3/uL (ref 3.9–10.3)

## 2017-08-31 NOTE — Patient Instructions (Signed)
Dr Denman George is recommending that you return to see her each summer for at least 3 years to monitor and evaluate the abdominal wall for recurrence of the desmoid tumor. She has ordered an MRI to evaluate the abdominal wall in 3 months time, then will recheck this at the visits.  She will recheck your blood counts today.  No additional treatments for the desmoid tumor are recommended. However, it is recommended that you have genetic testing to rule out a condition called FAP which is associated with an increased risk for colon cancer.  Dr Denman George recommends returning to work in the beginning of August.

## 2017-08-31 NOTE — Progress Notes (Signed)
Follow-up Note: Gyn-Onc  Consult was requested by Dr. Ihor Dow for the evaluation of Lisa Cline 32 y.o. female  CC:  Chief Complaint  Patient presents with  . Pelvic mass in female    Assessment/Plan:  [name]  is a 32 y.o.  year old with history of a 27 cm anterior abdominal wall desmoid tumor in the site of a prior incision.  The constellation of findings (young age at diagnosis, arising the abdominal wall and the site of a prior incision) are highly concerning for an underlying diagnosis of Gardner syndrome (FAP).  I am recommending referral to genetics counseling for evaluation and screening for this condition.  If present she will need consultation with the appropriate GI physicians to determine a course of screening and potentially prophylactic surgery.  With respect to follow-up of her desmoid tumor, and we discussed the potential for recurrence.  I discussed that adjuvant therapy is not indicated.  It is technically not a malignant lesion given the lack of capability for metastases or dissemination.  Surgery is not always indicated for these lesions, and in fact, increases the propensity for recurrence given that desmoid tumors tend to occur in the site of prior incisions.  Therefore our recommendation is to perform a baseline MRI in 3 months (after surgical changes have settled), followed by the physical exam of her lower abdominal wall in 12 months time with a repeated MRI at that time.  We will follow Lisa Cline for 3 to 5 years with these kind of exams and provided she remains free of apparent recurrence at that time we will suspend routine scheduled follow-up and instead recommend that she follow-up with her primary care doctor on an as-needed basis if she develops new symptoms of abdominal mass.  HPI: Lisa Cline is a very pleasant 32 year old nulliparous woman who is here in consultation at the request of Dr. Ihor Dow for a large  abdominopelvic mass causing bilateral ureteral compression.  The patient has a remote history of a left salpingo-oophorectomy and right ovarian cystectomy in 2016 via a low transverse incision for large bilateral dermoid cysts.  She did well after that surgery but began developing symptoms of increasing abdominal girth bloating and early satiety that were most notable in the early part of 2019.  This prompted her undergoing a CT scan of the abdomen and pelvis at no font which revealed a giant abdominopelvic mass measuring 22 x 25 x 13 cm that is predominantly solid and may arrives from the left ovary and extends from the pelvis into the abdomen above the umbilicus.  The anterior uterus is lobular suggesting a fibroid.  There is an apparent right ovarian dermoid containing cyst, fat, and calcification and measures 10 x 7 cm.  There is no pelvic free fluid or definite adenopathy.  There is compression of the ureters and colon causing mild bilateral hydronephrosis.  The patient is otherwise exceptionally healthy.  She has never been pregnant but desires future fertility.  She is engaged to be married.  Her last Pap smear was unremarkable with no high risk HPV in May 2019.  She takes no routine medications.  Her any prior surgery is the one listed above.  Her only prior family history for malignancy is a maternal grandmother with breast cancer in her 28s.  MRI pelvis and abdomen was performed on 08/01/17 and revealed marked interval enlargement of the previously described anterior pelvic mass, favored to arise from the left ovary. This was consistent with a  dermoid on the prior exam. Given interval enlargement and altered morphology, malignant transformation is a concern. The more posterior pelvic mass, favored to arise from the right ovary, is grossly similar to on the prior. Right hydronephrosis is mild and presumably secondary to mass effect.  Interval Hx: On 08/09/17 she underwent an exploratory laparotomy  with resection of an abdominal wall mass, partial  resection of rectus muscle, sigmoid colectomy with side-to-side stapled anastomosis, subserosal myomectomy, right ovarian cystectomy.  The procedure was complicated by an EBL of thousand cc requiring use of intraoperative Cell Saver transfusion.  Postoperatively she experienced acute blood loss anemia but did not require additional transfusions.  Intraoperative frozen section revealed a 30 cm solid vascular mass densely adherent to an infiltrating into or arising from the inferior rectus muscle bellies.  It was densely adherent to the dome of the bladder and the peritoneum but not infiltrating into the detrusor muscle.  It was densely adherent to a 10 cm segment of sigmoid colon at the level of the pelvic brim.  The mass was not adherent to or involving the uterus or right ovary.  The left ovary was surgically absent.  The uterus contained a 3 cm subserosal fundal fibroid.  The right ovary contained a 7 cm cystic mass which was also separate to the largest solid mass.  The appendix cecum transverse colon descending colon and small intestines were all normal palpably and visibly.  The diaphragm was smooth.  There was no residual tumor remaining at the completion of the surgery.  Frozen section returned as a myxoid tumor without clear malignancy but suspicious for sarcoma.  It did not appear consistent with a leiomyosarcoma.  Postoperatively she did well and was discharged on postoperative day 2.    Final pathology revealed a 27.5cm desmoid tumor weighing 4.5 kg.  The separate resection of the right rectus belly was positive for desmoid tumor.  The right ovarian cyst was positive for a benign dermoid cyst.  The myomectomy specimen was significant for benign leiomyoma.  Her case was presented at GI tumor board in the presence of surgical oncology, genetics, and GI medical oncology.  Her case and pathology and imaging were reviewed.  It was felt that the best  course of action was referral to genetics for consideration of underlying Gardner syndrome (FAP), and then surveillance with annual physical examinations of the low abdominal wall given the potential for recurrence.  Consideration for annual MRI imaging in the early surveillance.  Is reasonable.  Current Meds:  Outpatient Encounter Medications as of 08/31/2017  Medication Sig  . acetaminophen (TYLENOL) 500 MG tablet Take 2 tablets (1,000 mg total) by mouth every 6 (six) hours as needed for mild pain.  Marland Kitchen docusate sodium (COLACE) 100 MG capsule Take 100 mg by mouth daily.  Marland Kitchen enoxaparin (LOVENOX) 40 MG/0.4ML injection Inject 0.4 mLs (40 mg total) into the skin daily for 26 doses.  . ferrous gluconate (FERGON) 324 MG tablet Take 1 tablet (324 mg total) by mouth 2 (two) times daily with a meal.  . ibuprofen (ADVIL,MOTRIN) 600 MG tablet Take 1 tablet (600 mg total) by mouth every 6 (six) hours as needed for moderate pain.  . [DISCONTINUED] oxyCODONE (OXY IR/ROXICODONE) 5 MG immediate release tablet Take 1 tablet (5 mg total) by mouth at bedtime as needed for severe pain (Do not take with tramadol, use at bedtime, do not take and drive). (Patient not taking: Reported on 08/31/2017)  . [DISCONTINUED] senna-docusate (SENOKOT-S) 8.6-50 MG tablet Take  2 tablets by mouth at bedtime. (Patient not taking: Reported on 08/31/2017)  . [DISCONTINUED] traMADol (ULTRAM) 50 MG tablet Take 1 tablet (50 mg total) by mouth every 6 (six) hours as needed for moderate pain (Do not take with oxycodone, do not take and drive). (Patient not taking: Reported on 08/31/2017)   No facility-administered encounter medications on file as of 08/31/2017.     Allergy: No Known Allergies  Social Hx:   Social History   Socioeconomic History  . Marital status: Single    Spouse name: Not on file  . Number of children: Not on file  . Years of education: Not on file  . Highest education level: Not on file  Occupational History  . Not on  file  Social Needs  . Financial resource strain: Not on file  . Food insecurity:    Worry: Not on file    Inability: Not on file  . Transportation needs:    Medical: Not on file    Non-medical: Not on file  Tobacco Use  . Smoking status: Never Smoker  . Smokeless tobacco: Never Used  Substance and Sexual Activity  . Alcohol use: Yes    Comment: socially  . Drug use: Never  . Sexual activity: Yes    Birth control/protection: Condom  Lifestyle  . Physical activity:    Days per week: Not on file    Minutes per session: Not on file  . Stress: Not on file  Relationships  . Social connections:    Talks on phone: Not on file    Gets together: Not on file    Attends religious service: Not on file    Active member of club or organization: Not on file    Attends meetings of clubs or organizations: Not on file    Relationship status: Not on file  . Intimate partner violence:    Fear of current or ex partner: Not on file    Emotionally abused: Not on file    Physically abused: Not on file    Forced sexual activity: Not on file  Other Topics Concern  . Not on file  Social History Narrative  . Not on file    Past Surgical Hx:  Past Surgical History:  Procedure Laterality Date  . COLON RESECTION SIGMOID  08/09/2017   Procedure: COLON RESECTION SIGMOID W SIDE TO SIDE ANASTOMOSIS;  Surgeon: Everitt Amber, MD;  Location: [facility];  Service: Gynecology;;  . LAPAROTOMY N/A 04/23/2014   Procedure: LAPAROTOMY;  Surgeon: Lavonia Drafts, MD;  Location: [facility];  Service: Gynecology;  Laterality: N/A;  . LAPAROTOMY N/A 08/09/2017   Procedure: EXPLORATORY LAPAROTOMY;  Surgeon: Everitt Amber, MD;  Location: [facility];  Service: Gynecology;  Laterality: N/A;  . MASS EXCISION N/A 08/09/2017   Procedure: EXCISION OF PELVIC MASS;  Surgeon: Everitt Amber, MD;  Location: [facility];  Service: Gynecology;  Laterality: N/A;  . MYOMECTOMY N/A 08/09/2017   Procedure: POSSIBLE MYOMECTOMY;  Surgeon: Everitt Amber,  MD;  Location: [facility];  Service: Gynecology;  Laterality: N/A;  . NO PAST SURGERIES    . OVARIAN CYST REMOVAL Left 04/23/2014   Procedure: OVARIAN CYSTECTOMY;  Surgeon: Lavonia Drafts, MD;  Location: [facility];  Service: Gynecology;  Laterality: Left;  . OVARIAN CYST REMOVAL Right 08/09/2017   Procedure: RIGHT OVARIAN CYSTECTOMY;  Surgeon: Everitt Amber, MD;  Location: [facility];  Service: Gynecology;  Laterality: Right;  . SALPINGOOPHORECTOMY Right 04/23/2014   Procedure: SALPINGO OOPHORECTOMY;  Surgeon: Lavonia Drafts, MD;  Location: [facility];  Service: Gynecology;  Laterality: Right;    Past Medical Hx: History reviewed. No pertinent past medical history.  Past Gynecological History:  G0 Patient's last menstrual period was 08/08/2017.  Family Hx:  Family History  Problem Relation Age of Onset  . Hypertension Father   . Hypertension Mother   . Heart attack Mother   . Hypertension Brother   . Breast cancer Maternal Grandmother     Review of Systems:  Constitutional  Feels well,    ENT Normal appearing ears and nares bilaterally Skin/Breast  No rash, sores, jaundice, itching, dryness Cardiovascular  No chest pain, shortness of breath, or edema  Pulmonary  No cough or wheeze.  Gastro Intestinal  No nausea, vomitting, or diarrhoea. No bright red blood per rectum, no abdominal pain, change in bowel movement, or constipation. no early satiety, no distension. Genito Urinary  No frequency, urgency, dysuria,  Musculo Skeletal  No myalgia, arthralgia, joint swelling or pain  Neurologic  No weakness, numbness, change in gait,  Psychology  No depression, anxiety, insomnia.   Vitals:  Blood pressure 115/75, pulse 78, temperature 98 F (36.7 C), temperature source Oral, resp. rate 20, height 5\' 10"  (1.778 m), weight 200 lb (90.7 kg), last menstrual period 08/08/2017, SpO2 100 %.  Physical Exam: WD in NAD Neck  Supple NROM, without any enlargements.  Lymph Node Survey No  cervical supraclavicular or inguinal adenopathy Cardiovascular  Pulse normal rate, regularity and rhythm. S1 and S2 normal.  Lungs  Clear to auscultation bilateraly, without wheezes/crackles/rhonchi. Good air movement.  Skin  No rash/lesions/breakdown  Psychiatry  Alert and oriented to person, place, and time  Abdomen  Normoactive bowel sounds, abdomen soft, non-tender and nonobese without evidence of hernia. No palpable masses. Well healed vertical paramedian incision. No inflammation or infection. No palpable tumor in lower abdomen. Back No CVA tenderness Genito Urinary  deferred Rectal  deferred Extremities  No bilateral cyanosis, clubbing or edema.   30 minutes of direct face to face counseling time was spent with the patient. This included discussion about prognosis, therapy recommendations and postoperative side effects and are beyond the scope of routine postoperative care.   Thereasa Solo, MD  08/31/2017, 5:25 PM

## 2017-09-01 ENCOUNTER — Telehealth: Payer: Self-pay

## 2017-09-01 NOTE — Telephone Encounter (Signed)
Told Ms Bai the results of the CBC/Diff as noted below by Dr. Denman George.   Pt verbalized understanding.

## 2017-09-01 NOTE — Telephone Encounter (Signed)
-----   Message from Everitt Amber, MD sent at 08/31/2017  6:11 PM EDT ----- Patient can discontinue iron replacement therapy. She remains mildly anemic but it is not severe.  Thereasa Solo, MD  ----- Message ----- From: Buel Ream, [facility] [place] Sent: 08/31/2017   1:48 PM To: Everitt Amber, MD

## 2017-09-12 ENCOUNTER — Telehealth: Payer: Self-pay | Admitting: Gynecologic Oncology

## 2017-09-12 DIAGNOSIS — B373 Candidiasis of vulva and vagina: Secondary | ICD-10-CM

## 2017-09-12 DIAGNOSIS — B3731 Acute candidiasis of vulva and vagina: Secondary | ICD-10-CM

## 2017-09-12 MED ORDER — FLUCONAZOLE 150 MG PO TABS: 150.0000 mg | ORAL_TABLET | Freq: Once | ORAL | 0 refills | Status: AC

## 2017-09-12 NOTE — Telephone Encounter (Signed)
Patient called the office reporting clear, yellowish drainage from her incision at the umbilicus two days after pulling the dermabond off her incision in her belly button.  No fever, chills, or redness.  States she has been putting neosporin on her incision as well and has noticed small bumps in that area.  Advised to stop use of neosporin and keep the area and the incision at the umbilicus clean and dry.  She is advised to call in the am with an update to assess whether an appt is needed to check her incision.  She also states she feels she has a yeast infection again.  States she has vulvar itching with some discharge.  Declining prescription for external yeast cream.  No other concerns voiced.  Advised to call in the am with an update or sooner if needed.

## 2017-09-13 ENCOUNTER — Encounter: Payer: Self-pay | Admitting: Gynecologic Oncology

## 2017-09-13 ENCOUNTER — Inpatient Hospital Stay (HOSPITAL_BASED_OUTPATIENT_CLINIC_OR_DEPARTMENT_OTHER): Payer: 59 | Admitting: Gynecologic Oncology

## 2017-09-13 VITALS — BP 124/80 | HR 75 | Temp 99.1°F | Resp 20 | Ht 70.0 in | Wt 202.6 lb

## 2017-09-13 DIAGNOSIS — Z5189 Encounter for other specified aftercare: Secondary | ICD-10-CM

## 2017-09-13 DIAGNOSIS — D250 Submucous leiomyoma of uterus: Secondary | ICD-10-CM

## 2017-09-13 DIAGNOSIS — N83201 Unspecified ovarian cyst, right side: Secondary | ICD-10-CM

## 2017-09-13 NOTE — Progress Notes (Signed)
Follow Up Note: Gyn-Onc  Lisa Cline 32 y.o. female  CC:  Chief Complaint  Patient presents with  . Visit for wound check    HPI:  Lisa Cline is a 32 year old initially seen in consultation at the request of Dr. Ihor Dow for a large abdominopelvic mass causing bilateral ureteral compression. Her history includes a left salpingo-oophorectomy and right ovarian cystectomy in 2016 via a low transverse incision for large bilateral dermoid cysts.  She recovered without issues from that operation but noted increasing abdominal girth bloating and early satiety in the early part of 2019.  For this, she underwent a CT scan of the abdomen and pelvis at [facility] which revealed a giant abdominopelvic mass measuring 22 x 25 x 13 cm that is predominantly solid and may arise from the left ovary and extends from the pelvis into the abdomen above the umbilicus.  The anterior uterus is lobular suggesting a fibroid.  There is an apparent right ovarian dermoid containing cyst, fat, and calcification and measures 10 x 7 cm.  There is no pelvic free fluid or definite adenopathy.  There is compression of the ureters and colon causing mild bilateral hydronephrosis.  MRI pelvis and abdomen was performed on 08/01/17 and revealed marked interval enlargement of the previously described anterior pelvic mass, favored to arise from the left ovary. This was consistent with a dermoid on the prior exam. Given interval enlargement and altered morphology, malignant transformation is a concern. The more posterior pelvic mass, favored to arise from the right ovary, is grossly similar to on the prior. Right hydronephrosis is mild and presumably secondary to mass effect.  On 08/09/17 she underwent an exploratory laparotomy with resection of an abdominal wall mass, partial  resection of rectus muscle, sigmoid colectomy with side-to-side stapled anastomosis, subserosal myomectomy, right ovarian cystectomy.  The procedure was  complicated by an EBL of thousand cc requiring use of intraoperative Cell Saver transfusion.  Postoperatively she experienced acute blood loss anemia but did not require additional transfusions.  Intraoperative frozen section revealed a 30 cm solid vascular mass densely adherent to an infiltrating into or arising from the inferior rectus muscle bellies.  It was densely adherent to the dome of the bladder and the peritoneum but not infiltrating into the detrusor muscle.  It was densely adherent to a 10 cm segment of sigmoid colon at the level of the pelvic brim.  The mass was not adherent to or involving the uterus or right ovary.  The left ovary was surgically absent.  The uterus contained a 3 cm subserosal fundal fibroid.  The right ovary contained a 7 cm cystic mass which was also separate to the largest solid mass.  The appendix cecum transverse colon descending colon and small intestines were all normal palpably and visibly.  The diaphragm was smooth.  There was no residual tumor remaining at the completion of the surgery.  Frozen section returned as a myxoid tumor without clear malignancy but suspicious for sarcoma.  It did not appear consistent with a leiomyosarcoma.  Her post-operative course was uneventful and she was discharged on postoperative day 2.  Final pathology revealed a 27.5cm desmoid tumor weighing 4.5 kg.  The separate resection of the right rectus belly was positive for desmoid tumor.  The right ovarian cyst was positive for a benign dermoid cyst.  The myomectomy specimen was significant for benign leiomyoma.  Her case was presented at GI tumor board in the presence of surgical oncology, genetics, and GI medical  oncology.  Her case, pathology, and imaging were reviewed.  It was felt that the best course of action was referral to genetics for consideration of underlying Gardner syndrome (FAP), and then surveillance with annual physical examinations of the low abdominal wall given the  potential for recurrence.  Consideration for annual MRI imaging in the early surveillance considered reasonable.  Interval History: She presents today for evaluation of incisional drainage.  She called the office yesterday about concerns related to clear, yellowish incisional drainage at the umbilicus after pulling off the dermabond glue and about tiny bumps she noticed on her incision.  No fever or chills reported.  No increased erythema.  She has been using neosporin on her incision along with mederma scar oil.  Asking about using "scar sheets" to assist with incisional healing and decreased scar formation.  Bowels and bladder functioning without difficulty.  No other concerns voiced.   Review of Systems: Negative 10 point review.    Current Meds:  Outpatient Encounter Medications as of 09/13/2017  Medication Sig  . docusate sodium (COLACE) 100 MG capsule Take 100 mg by mouth daily.  . Multiple Vitamin (MULTIVITAMIN) tablet Take 1 tablet by mouth daily.  . vitamin E 600 UNIT capsule Take 600 Units by mouth daily.  Marland Kitchen enoxaparin (LOVENOX) 40 MG/0.4ML injection Inject 0.4 mLs (40 mg total) into the skin daily for 26 doses.  . [DISCONTINUED] acetaminophen (TYLENOL) 500 MG tablet Take 2 tablets (1,000 mg total) by mouth every 6 (six) hours as needed for mild pain. (Patient not taking: Reported on 09/13/2017)  . [DISCONTINUED] ferrous gluconate (FERGON) 324 MG tablet Take 1 tablet (324 mg total) by mouth 2 (two) times daily with a meal. (Patient not taking: Reported on 09/13/2017)  . [DISCONTINUED] ibuprofen (ADVIL,MOTRIN) 600 MG tablet Take 1 tablet (600 mg total) by mouth every 6 (six) hours as needed for moderate pain. (Patient not taking: Reported on 09/13/2017)   No facility-administered encounter medications on file as of 09/13/2017.     Allergy: No Known Allergies  Social Hx:   Social History   Socioeconomic History  . Marital status: Single    Spouse name: Not on file  . Number of  children: Not on file  . Years of education: Not on file  . Highest education level: Not on file  Occupational History  . Not on file  Social Needs  . Financial resource strain: Not on file  . Food insecurity:    Worry: Not on file    Inability: Not on file  . Transportation needs:    Medical: Not on file    Non-medical: Not on file  Tobacco Use  . Smoking status: Never Smoker  . Smokeless tobacco: Never Used  Substance and Sexual Activity  . Alcohol use: Yes    Comment: socially  . Drug use: Never  . Sexual activity: Yes    Birth control/protection: Condom  Lifestyle  . Physical activity:    Days per week: Not on file    Minutes per session: Not on file  . Stress: Not on file  Relationships  . Social connections:    Talks on phone: Not on file    Gets together: Not on file    Attends religious service: Not on file    Active member of club or organization: Not on file    Attends meetings of clubs or organizations: Not on file    Relationship status: Not on file  . Intimate partner violence:  Fear of current or ex partner: Not on file    Emotionally abused: Not on file    Physically abused: Not on file    Forced sexual activity: Not on file  Other Topics Concern  . Not on file  Social History Narrative  . Not on file    Past Surgical Hx:  Past Surgical History:  Procedure Laterality Date  . COLON RESECTION SIGMOID  08/09/2017   Procedure: COLON RESECTION SIGMOID W SIDE TO SIDE ANASTOMOSIS;  Surgeon: Everitt Amber, MD;  Location: [facility];  Service: Gynecology;;  . LAPAROTOMY N/A 04/23/2014   Procedure: LAPAROTOMY;  Surgeon: Lavonia Drafts, MD;  Location: [place] [facility];  Service: Gynecology;  Laterality: N/A;  . LAPAROTOMY N/A 08/09/2017   Procedure: EXPLORATORY LAPAROTOMY;  Surgeon: Everitt Amber, MD;  Location: [facility];  Service: Gynecology;  Laterality: N/A;  . MASS EXCISION N/A 08/09/2017   Procedure: EXCISION OF PELVIC MASS;  Surgeon: Everitt Amber, MD;  Location: [facility];  Service: Gynecology;  Laterality: N/A;  . MYOMECTOMY N/A 08/09/2017   Procedure: POSSIBLE MYOMECTOMY;  Surgeon: Everitt Amber, MD;  [facility]: [facility];  Service: Gynecology;  Laterality: N/A;  . NO PAST SURGERIES    . OVARIAN CYST REMOVAL Left 04/23/2014   Procedure: OVARIAN CYSTECTOMY;  Surgeon: Lavonia Drafts, MD;  Location: [place] [facility];  Service: Gynecology;  Laterality: Left;  . OVARIAN CYST REMOVAL Right 08/09/2017   Procedure: RIGHT OVARIAN CYSTECTOMY;  Surgeon: Everitt Amber, MD;  [facility];  Service: Gynecology;  Laterality: Right;  . SALPINGOOPHORECTOMY Right 04/23/2014   Procedure: SALPINGO OOPHORECTOMY;  Surgeon: Lavonia Drafts, MD;  Location: [place] [facility];  Service: Gynecology;  Laterality: Right;    Past Medical Hx: History reviewed. No pertinent past medical history.  Family Hx:  Family History  Problem Relation Age of Onset  . Hypertension Father   . Hypertension Mother   . Heart attack Mother   . Hypertension Brother   . Breast cancer Maternal Grandmother     Vitals:  Blood pressure 124/80, pulse 75, temperature 99.1 F (37.3 C), temperature source Oral, resp. rate 20, height 5\' 10"  (1.778 m), weight 202 lb 9.6 oz (91.9 kg), SpO2 100 %.  Physical Exam: General: Well developed, well nourished female in no acute distress. Alert and oriented x 3.  Abdomen: Abdomen soft, non-tender and non-obese. Midline incision healing nicely with no evidence of infection or drainage.  Scant amount of dermabond glue still present along edges of incision.  Raised papules present around the incision edges and in the umbilicus.  No erythema or pustules noted.  57mm skin abrasion noted in the umbilicus without erythema or drainage.   Extremities: No bilateral cyanosis, edema, or clubbing.   Assessment/Plan: 32 year old female s/p exploratory laparotomy with resection of an abdominal wall mass, partial  resection of rectus muscle, sigmoid colectomy with side-to-side stapled anastomosis,  subserosal myomectomy, right ovarian cystectomy on 08/09/17 with Dr. Denman George for a desmoid tumor.  She is doing well post-operatively.  Discussed incision care and advised to hold off on usage of neosporin, scar oil, and scar healing dressings until skin irritation resolves.  Advised to use hydrocortisone cream around the umbilicus and along the edges of the incision to hopefully finish loosening the remaining dermabond and treat mild dermatitis of the skin most related to irritation from the glue.  She is to contact the office for any worsening symptoms, questions, or concerns.     Lisa Gibbs, NP 09/14/2017, 12:30 PM

## 2017-09-13 NOTE — Patient Instructions (Signed)
Plan to apply hydrocortisone cream to the small bumps near the incision and massage it in to assist with loosening the dermabond glue.  Avoid use of other topical scar creams or oils until healed. Please call the office with an update or for any questions or concerns.

## 2017-09-14 ENCOUNTER — Telehealth: Payer: Self-pay

## 2017-09-14 NOTE — Telephone Encounter (Signed)
Outgoing call to patient per Joylene John NP regarding to see "how bumps and drainage at belly button are ".  Pt reports area as the same - has been using hydrocortisone as instructed.  Said there is still some drainage that has dried and she sees on q-tip when she is applying hydrocortisone, reports as its like clear-yellow dried specks on the q-tip from inside the belly-button. Reported to [place] NP and instructed per her , to continue using hydrocortisone over the next week.  Told pt to call our office if sees any changes in area, worsening as in developing a fever, redness, change in drainage color or odor.  Pt voiced understanding. No other needs per pt at this time.

## 2017-09-22 ENCOUNTER — Encounter: Payer: Self-pay | Admitting: Gynecologic Oncology

## 2017-09-26 ENCOUNTER — Encounter: Payer: Self-pay | Admitting: Gynecologic Oncology

## 2017-10-03 ENCOUNTER — Inpatient Hospital Stay: Payer: 59 | Attending: Gynecologic Oncology | Admitting: Genetic Counselor

## 2017-10-03 ENCOUNTER — Telehealth: Payer: Self-pay | Admitting: *Deleted

## 2017-10-03 ENCOUNTER — Encounter: Payer: Self-pay | Admitting: Genetic Counselor

## 2017-10-03 ENCOUNTER — Inpatient Hospital Stay: Payer: 59

## 2017-10-03 DIAGNOSIS — Z8049 Family history of malignant neoplasm of other genital organs: Secondary | ICD-10-CM | POA: Diagnosis not present

## 2017-10-03 DIAGNOSIS — Z8371 Family history of colonic polyps: Secondary | ICD-10-CM

## 2017-10-03 DIAGNOSIS — D481 Neoplasm of uncertain behavior of connective and other soft tissue: Secondary | ICD-10-CM

## 2017-10-03 DIAGNOSIS — Z1379 Encounter for other screening for genetic and chromosomal anomalies: Secondary | ICD-10-CM

## 2017-10-03 DIAGNOSIS — Z803 Family history of malignant neoplasm of breast: Secondary | ICD-10-CM | POA: Diagnosis not present

## 2017-10-03 DIAGNOSIS — Z83719 Family history of colon polyps, unspecified: Secondary | ICD-10-CM

## 2017-10-03 DIAGNOSIS — D48118 Desmoid tumor of other site: Secondary | ICD-10-CM

## 2017-10-03 NOTE — Progress Notes (Signed)
REFERRING PROVIDER: Everitt Amber, MD [place], [place] [address]  PRIMARY PROVIDER:  Berton Lan A, PA-C  PRIMARY REASON FOR VISIT:  1. Desmoid tumor of abdomen   2. Family history of breast cancer   3. Family history of uterine cancer   4. Family history of colonic polyps      HISTORY OF PRESENT ILLNESS:   [name], a 32 y.o. female, was seen for a [facility] cancer genetics consultation at the request of Dr. Denman George due to a personal history of a desmoid tumor and a family history of cancer.  Lisa Cline presents to clinic today to discuss the possibility of a hereditary predisposition to cancer, genetic testing, and to further clarify her future cancer risks, as well as potential cancer risks for family members.   In 2019, at the age of 59, [name] was diagnosed with desmoid tumor of the abdominal wall. This was treated with surgical removal of the tumor.  The tumor was large, approximately 10#, and was attached to the colon.     CANCER HISTORY:   No history exists.     HORMONAL RISK FACTORS:  Menarche was at age 77.  First live birth at age N/A.  OCP use for approximately 0 years.  Ovaries intact: patient had left ovary and fallopian tube removed.  Hysterectomy: no.  Menopausal status: premenopausal.  HRT use: 0 years. Colonoscopy: no; not examined. Mammogram within the last year: no. Number of breast biopsies: 0. Up to date with pelvic exams:  yes. Any excessive radiation exposure in the past:  no  Past Medical History:  Diagnosis Date  . Desmoid tumor of abdomen   . Family history of breast cancer   . Family history of colonic polyps   . Family history of uterine cancer     Past Surgical History:  Procedure Laterality Date  . COLON RESECTION SIGMOID  08/09/2017   Procedure: COLON RESECTION SIGMOID W SIDE TO SIDE ANASTOMOSIS;  Surgeon: Everitt Amber, MD;  Location: [facility];  Service: Gynecology;;  . LAPAROTOMY N/A 04/23/2014   Procedure:  LAPAROTOMY;  Surgeon: Lavonia Drafts, MD;  Location: [place] [facility];  Service: Gynecology;  Laterality: N/A;  . LAPAROTOMY N/A 08/09/2017   Procedure: EXPLORATORY LAPAROTOMY;  Surgeon: Everitt Amber, MD;  [facility];  Service: Gynecology;  Laterality: N/A;  . MASS EXCISION N/A 08/09/2017   Procedure: EXCISION OF PELVIC MASS;  Surgeon: Everitt Amber, MD;  [facility];  Service: Gynecology;  Laterality: N/A;  . MYOMECTOMY N/A 08/09/2017   Procedure: POSSIBLE MYOMECTOMY;  Surgeon: Everitt Amber, MD;  [facility];  Service: Gynecology;  Laterality: N/A;  . NO PAST SURGERIES    . OVARIAN CYST REMOVAL Left 04/23/2014   Procedure: OVARIAN CYSTECTOMY;  Surgeon: Lavonia Drafts, MD;  Location: [place] [facility];  Service: Gynecology;  Laterality: Left;  . OVARIAN CYST REMOVAL Right 08/09/2017   Procedure: RIGHT OVARIAN CYSTECTOMY;  Surgeon: Everitt Amber, MD;  [facility];  Service: Gynecology;  Laterality: Right;  . SALPINGOOPHORECTOMY Right 04/23/2014   Procedure: SALPINGO OOPHORECTOMY;  Surgeon: Lavonia Drafts, MD;  Location: [place] [facility];  Service: Gynecology;  Laterality: Right;    Social History   Socioeconomic History  . Marital status: Single    Spouse name: Not on file  . Number of children: Not on file  . Years of education: Not on file  . Highest education level: Not on file  Occupational History  . Not on file  Social Needs  .  Financial resource strain: Not on file  . Food insecurity:    Worry: Not on file    Inability: Not on file  . Transportation needs:    Medical: Not on file    Non-medical: Not on file  Tobacco Use  . Smoking status: Never Smoker  . Smokeless tobacco: Never Used  Substance and Sexual Activity  . Alcohol use: Yes    Comment: socially  . Drug use: Never  . Sexual activity: Yes    Birth control/protection: Condom  Lifestyle  . Physical activity:    Days per week: Not on file    Minutes per session: Not on file  . Stress: Not on file   Relationships  . Social connections:    Talks on phone: Not on file    Gets together: Not on file    Attends religious service: Not on file    Active member of club or organization: Not on file    Attends meetings of clubs or organizations: Not on file    Relationship status: Not on file  Other Topics Concern  . Not on file  Social History Narrative  . Not on file     FAMILY HISTORY:  We obtained a detailed, 4-generation family history.  Significant diagnoses are listed below: Family History  Problem Relation Age of Onset  . Hypertension Father   . Hypertension Mother   . Heart attack Mother 68  . Hypertension Brother   . Breast cancer Maternal Grandmother 50       d. 32  . Heart attack Maternal Grandfather   . Heart attack Paternal Grandmother   . Heart attack Paternal Grandfather   . Uterine cancer Paternal Aunt 31  . Uterine cancer Other        PGFs sister  . Colon polyps Cousin        pat first cousin with multiple polyps    The patient does not have children.  She has one full brother who is cancer free.  She has a paternal half brother and sister who are cancer free, and two maternal half brothers, one who died in infancy, who are cancer free.  The patient's mother died of a heart attack and her father is living.  The patient's mother had three brothers and a sister who do not have cancer.  Her mother died of breast cancer at 75 and her father died of a heart attack between 38-70.  The patient's father is alive at 58.  He had four sisters and four brothers.  One sister had uterine cancer at 19 and a nephew has had colon polyps. Both parents are deceased. His father's sister also had uterine cancer.   Lisa Cline is unaware of previous family history of genetic testing for hereditary cancer risks. Patient's maternal ancestors are of [place] American and Caucasian descent, and paternal ancestors are of [place] American and Native American descent. There is no reported  Ashkenazi Jewish ancestry. There is no known consanguinity.  GENETIC COUNSELING ASSESSMENT: KYNSLEI ART is a 32 y.o. female with a personal history of a desmoid tumor and family history of cancer which is somewhat suggestive of a hereditary cancer syndrome and predisposition to cancer. We, therefore, discussed and recommended the following at today's visit.   DISCUSSION: We discussed that desmoid tumors are rare tumors, most commonly in the abdomen.  Most desmoid tumors are sporadic, but a small percentage are due to familial adenomatous polyposis, or FAP.  Somatic tumors in either the  CTNNB1 or APC gene can cause sporadic desmoid tumors, and germline mutations in APC are associated with inherited desmoid tumors.  We discussed that about 20% of individuals with APC mutations but no family history of FAP, have a de novo (new) mutation.   We reviewed the characteristics, features and inheritance patterns of hereditary cancer syndromes. We also discussed genetic testing, including the appropriate family members to test, the process of testing, insurance coverage and turn-around-time for results. We discussed the implications of a negative, positive and/or variant of uncertain significant result. We recommended Ms. Vasko pursue genetic testing for the multi-cancer gene panel. The Multi-Gene Panel offered by [facility] includes sequencing and/or deletion duplication testing of the following 84 genes: AIP, ALK, APC, ATM, AXIN2,BAP1,  BARD1, BLM, BMPR1A, BRCA1, BRCA2, BRIP1, CASR, CDC73, CDH1, CDK4, CDKN1B, CDKN1C, CDKN2A (p14ARF), CDKN2A (p16INK4a), CEBPA, CHEK2, CTNNA1, DICER1, DIS3L2, EGFR (c.2369C>T, p.Thr790Met variant only), EPCAM (Deletion/duplication testing only), FH, FLCN, GATA2, GPC3, GREM1 (Promoter region deletion/duplication testing only), HOXB13 (c.251G>A, p.Gly84Glu), HRAS, KIT, MAX, MEN1, MET, MITF (c.952G>A, p.Glu318Lys variant only), MLH1, MSH2, MSH3, MSH6, MUTYH, NBN, NF1, NF2, NTHL1, PALB2,  PDGFRA, PHOX2B, PMS2, POLD1, POLE, POT1, PRKAR1A, PTCH1, PTEN, RAD50, RAD51C, RAD51D, RB1, RECQL4, RET, RUNX1, SDHAF2, SDHA (sequence changes only), SDHB, SDHC, SDHD, SMAD4, SMARCA4, SMARCB1, SMARCE1, STK11, SUFU, TERC, TERT, TMEM127, TP53, TSC1, TSC2, VHL, WRN and WT1.    Per [facility], individuals with desmoid tumors should be tested for FAP. She has a family history of uterine cancer and colon polyps that can raise suspicion for Lynch syndrome. Based on Ms. Wilcoxson's personal history of cancer, she meets medical criteria for genetic testing. Despite that she meets criteria, she may still have an out of pocket cost. We discussed that if her out of pocket cost for testing is over $100, the laboratory will call and confirm whether she wants to proceed with testing.  If the out of pocket cost of testing is less than $100 she will be billed by the genetic testing laboratory.   PLAN: After considering the risks, benefits, and limitations, Ms. Cura  provided informed consent to pursue genetic testing and the blood sample was sent to [facility] for analysis of the Multi-cancer gene panel. Results should be available within approximately 2-3 weeks' time, at which point they will be disclosed by telephone to Ms. Keach, as will any additional recommendations warranted by these results. Ms. Utecht will receive a summary of her genetic counseling visit and a copy of her results once available. This information will also be available in Epic. We encouraged Ms. Goh to remain in contact with cancer genetics annually so that we can continuously update the family history and inform her of any changes in cancer genetics and testing that may be of benefit for her family. Ms. Susan questions were answered to her satisfaction today. Our contact information was provided should additional questions or concerns arise.  Lastly, we encouraged Ms. Torrisi to remain in contact with cancer genetics annually so that we can  continuously update the family history and inform her of any changes in cancer genetics and testing that may be of benefit for this family.   Ms.  Hoobler questions were answered to her satisfaction today. Our contact information was provided should additional questions or concerns arise. Thank you for the referral and allowing [place] to share in the care of your patient.   Amsi Grimley P. Florene Glen, [place], [facility] Certified Genetic Counselor [email or web address] phone: [phone]  The patient was seen for a total of 47  minutes in face-to-face genetic counseling.  This patient was discussed with Drs. Magrinat, Lindi Adie and/or Burr Medico who agrees with the above.    _______________________________________________________________________ For Office Staff:  Number of people involved in session: 2 Was an Intern/ student involved with case: no

## 2017-10-03 NOTE — Telephone Encounter (Signed)
Returned patient's phone call concerning back to work paperwork.  I told patient that we did receive the paper work that her job faxed over. Our office will fax the paper work back when completed.  Patient was very appreciative and verbalized understanding.

## 2017-10-12 ENCOUNTER — Telehealth: Payer: Self-pay | Admitting: Genetic Counselor

## 2017-10-12 ENCOUNTER — Ambulatory Visit: Payer: Self-pay | Admitting: Genetic Counselor

## 2017-10-12 ENCOUNTER — Encounter: Payer: Self-pay | Admitting: Genetic Counselor

## 2017-10-12 DIAGNOSIS — D481 Neoplasm of uncertain behavior of connective and other soft tissue: Secondary | ICD-10-CM

## 2017-10-12 DIAGNOSIS — Z1379 Encounter for other screening for genetic and chromosomal anomalies: Secondary | ICD-10-CM | POA: Insufficient documentation

## 2017-10-12 NOTE — Telephone Encounter (Signed)
Revealed negative genetic testing.  Discussed that we do not know why she has a desmoid tumor or why there is cancer in the family. It could be due to a different gene that we are not testing, or maybe our current technology may not be able to pick something up.  It will be important for her to keep in contact with genetics to keep up with whether additional testing may be needed.

## 2017-10-12 NOTE — Progress Notes (Signed)
HPI:  Lisa Cline was previously seen in the [facility] due to a personal history of a desmoid tumor and family history of cancer and concerns regarding a hereditary predisposition to cancer. Please refer to our prior cancer genetics clinic note for more information regarding Lisa Cline's medical, social and family histories, and our assessment and recommendations, at the time. Lisa Cline recent genetic test results were disclosed to her, as were recommendations warranted by these results. These results and recommendations are discussed in more detail below.  CANCER HISTORY:   No history exists.    FAMILY HISTORY:  We obtained a detailed, 4-generation family history.  Significant diagnoses are listed below: Family History  Problem Relation Age of Onset  . Hypertension Father   . Hypertension Mother   . Heart attack Mother 52  . Hypertension Brother   . Breast cancer Maternal Grandmother 50       d. 16  . Heart attack Maternal Grandfather   . Heart attack Paternal Grandmother   . Heart attack Paternal Grandfather   . Uterine cancer Paternal Aunt 57  . Uterine cancer Other        PGFs sister  . Colon polyps Cousin        pat first cousin with multiple polyps    The patient does not have children.  She has one full brother who is cancer free.  She has a paternal half brother and sister who are cancer free, and two maternal half brothers, one who died in infancy, who are cancer free.  The patient's mother died of a heart attack and her father is living.  The patient's mother had three brothers and a sister who do not have cancer.  Her mother died of breast cancer at 70 and her father died of a heart attack between 65-70.  The patient's father is alive at 24.  He had four sisters and four brothers.  One sister had uterine cancer at 42 and a nephew has had colon polyps. Both parents are deceased. His father's sister also had uterine cancer.   Lisa Cline is unaware  of previous family history of genetic testing for hereditary cancer risks. Patient's maternal ancestors are of [place] American and Caucasian descent, and paternal ancestors are of [place] American and Native American descent. There is no reported Ashkenazi Jewish ancestry. There is no known consanguinity.  GENETIC TEST RESULTS: Genetic testing reported out on October 11, 2017 through the multi-cancer panel found no deleterious mutations.  The Multi-Gene Panel offered by [facility] includes sequencing and/or deletion duplication testing of the following 84 genes: AIP, ALK, APC, ATM, AXIN2,BAP1,  BARD1, BLM, BMPR1A, BRCA1, BRCA2, BRIP1, CASR, CDC73, CDH1, CDK4, CDKN1B, CDKN1C, CDKN2A (p14ARF), CDKN2A (p16INK4a), CEBPA, CHEK2, CTNNA1, DICER1, DIS3L2, EGFR (c.2369C>T, p.Thr790Met variant only), EPCAM (Deletion/duplication testing only), FH, FLCN, GATA2, GPC3, GREM1 (Promoter region deletion/duplication testing only), HOXB13 (c.251G>A, p.Gly84Glu), HRAS, KIT, MAX, MEN1, MET, MITF (c.952G>A, p.Glu318Lys variant only), MLH1, MSH2, MSH3, MSH6, MUTYH, NBN, NF1, NF2, NTHL1, PALB2, PDGFRA, PHOX2B, PMS2, POLD1, POLE, POT1, PRKAR1A, PTCH1, PTEN, RAD50, RAD51C, RAD51D, RB1, RECQL4, RET, RUNX1, SDHAF2, SDHA (sequence changes only), SDHB, SDHC, SDHD, SMAD4, SMARCA4, SMARCB1, SMARCE1, STK11, SUFU, TERC, TERT, TMEM127, TP53, TSC1, TSC2, VHL, WRN and WT1.  The test report has been scanned into EPIC and is located under the Molecular Pathology section of the Results Review tab.    We discussed with Lisa Cline that since the current genetic testing is not perfect, it is possible there may  be a gene mutation in one of these genes that current testing cannot detect, but that chance is small.  Currently, it is estimated that up to 90% of APC mutations are found through sequencing of the APC gene and 8-12% are found through del/dup testing.  We also discussed, that it is possible that another gene that has not yet been discovered, or that  we have not yet tested, is responsible for her desmoid tumor or the cancer diagnoses in the family, and it is, therefore, important to remain in touch with cancer genetics in the future so that we can continue to offer Lisa Cline the most up to date genetic testing.    Genetic testing did detect a Variant of Unknown Significance in the SDHA gene called c.1935C>G.  At this time, it is unknown if this variant is associated with increased cancer risk or if this is a normal finding, but most variants such as this get reclassified to being inconsequential. It should not be used to make medical management decisions. With time, we suspect the lab will determine the significance of this variant, if any. If we do learn more about it, we will try to contact Lisa Cline to discuss it further. However, it is important to stay in touch with [place] periodically and keep the address and phone number up to date.   CANCER SCREENING RECOMMENDATIONS:  This result is reassuring and indicates that Lisa Cline likely does not have an increased risk for a future cancer due to a mutation in one of these genes. We discussed that there is an increased risk that her desmoid tumor could reoccur.  This could be as high as 20-30% chance.  This normal test also suggests that Lisa Cline's tumor was most likely not due to an inherited predisposition associated with one of these genes.  Most tumors/cancers happen by chance and this negative test suggests that her tumor falls into this category.  We, therefore, recommended she continue to follow the cancer management and screening guidelines provided by her oncology and primary healthcare provider.   An individual's cancer risk and medical management are not determined by genetic test results alone. Overall cancer risk assessment incorporates additional factors, including personal medical history, family history, and any available genetic information that may result in a personalized plan for cancer  prevention and surveillance.  RECOMMENDATIONS FOR FAMILY MEMBERS:  Individuals in this family might be at some increased risk of developing cancer, over the general population risk, simply due to the family history of cancer.  We recommended women in this family have a yearly mammogram beginning at age 46, or 7 years younger than the earliest onset of cancer, an annual clinical breast exam, and perform monthly breast self-exams. Women in this family should also have a gynecological exam as recommended by their primary provider. All family members should have a colonoscopy by age 69.  FOLLOW-UP: Lastly, we discussed with Ms. Bergerson that cancer genetics is a rapidly advancing field and it is possible that new genetic tests will be appropriate for her and/or her family members in the future. We encouraged her to remain in contact with cancer genetics on an annual basis so we can update her personal and family histories and let her know of advances in cancer genetics that may benefit this family.   Our contact number was provided. Ms. Budden questions were answered to her satisfaction, and she knows she is welcome to call us at anytime with additional questions or concerns.  Roma Kayser, MS, [facility] Certified Genetic Counselor [email or web address]

## 2017-11-29 ENCOUNTER — Ambulatory Visit (HOSPITAL_COMMUNITY): Admission: RE | Admit: 2017-11-29 | Payer: 59 | Source: Ambulatory Visit

## 2017-12-03 ENCOUNTER — Ambulatory Visit (HOSPITAL_COMMUNITY)
Admission: RE | Admit: 2017-12-03 | Discharge: 2017-12-03 | Disposition: A | Discharge status: Home / Self Care | Payer: 59 | Source: Ambulatory Visit | Attending: Gynecologic Oncology | Admitting: Gynecologic Oncology

## 2017-12-03 DIAGNOSIS — D481 Neoplasm of uncertain behavior of connective and other soft tissue: Secondary | ICD-10-CM | POA: Diagnosis not present

## 2017-12-03 MED ORDER — GADOBUTROL 1 MMOL/ML IV SOLN
10.0000 mL | Freq: Once | INTRAVENOUS | Status: AC | PRN
  Administered 2017-12-03: 10 mL via INTRAVENOUS

## 2017-12-07 ENCOUNTER — Telehealth: Payer: Self-pay

## 2017-12-07 NOTE — Telephone Encounter (Signed)
Outgoing call to patient per Lisa John NP regarding MRI pelvis- " no evidence of residual or metastatic tumor within the pelvis."  Pt voiced understanding and no other needs per pt at this time.  Pt reports she will call back for f/u next June- encouraged to call in Feb 2020 to schedule June 2020 f/u with Dr Denman George. Voiced understanding.

## 2018-11-29 ENCOUNTER — Other Ambulatory Visit: Payer: Self-pay | Admitting: Gynecologic Oncology

## 2018-11-29 ENCOUNTER — Telehealth: Payer: Self-pay | Admitting: *Deleted

## 2018-11-29 DIAGNOSIS — D481 Neoplasm of uncertain behavior of connective and other soft tissue: Secondary | ICD-10-CM

## 2018-11-29 NOTE — Telephone Encounter (Signed)
Patient called and scheduled appt to see Dr. Denman George. Patient also needs MRI before appt

## 2018-12-16 ENCOUNTER — Ambulatory Visit
Admission: RE | Admit: 2018-12-16 | Discharge: 2018-12-16 | Disposition: A | Discharge status: Home / Self Care | Payer: 59 | Source: Ambulatory Visit | Attending: Gynecologic Oncology | Admitting: Gynecologic Oncology

## 2018-12-16 ENCOUNTER — Other Ambulatory Visit: Payer: Self-pay

## 2018-12-16 DIAGNOSIS — D481 Neoplasm of uncertain behavior of connective and other soft tissue: Secondary | ICD-10-CM

## 2018-12-16 MED ORDER — GADOBUTROL 1 MMOL/ML IV SOLN
8.0000 mL | Freq: Once | INTRAVENOUS | Status: AC | PRN
  Administered 2018-12-16: 8 mL via INTRAVENOUS

## 2018-12-20 ENCOUNTER — Inpatient Hospital Stay: Payer: 59 | Attending: Gynecologic Oncology | Admitting: Gynecologic Oncology

## 2018-12-20 ENCOUNTER — Encounter: Payer: Self-pay | Admitting: Gynecologic Oncology

## 2018-12-20 ENCOUNTER — Other Ambulatory Visit: Payer: Self-pay

## 2018-12-20 VITALS — BP 135/88 | HR 72 | Temp 98.2°F | Resp 17 | Ht 70.0 in | Wt 185.0 lb

## 2018-12-20 DIAGNOSIS — D481 Neoplasm of uncertain behavior of connective and other soft tissue: Secondary | ICD-10-CM | POA: Diagnosis present

## 2018-12-20 DIAGNOSIS — R1900 Intra-abdominal and pelvic swelling, mass and lump, unspecified site: Secondary | ICD-10-CM | POA: Diagnosis present

## 2018-12-20 NOTE — Progress Notes (Signed)
Follow-up Note: Gyn-Onc  Consult was requested by Dr. Ihor Dow for the evaluation of Lisa Cline 33 y.o. female  CC:  Chief Complaint  Patient presents with  . Pelvic mass in female    Assessment/Plan:  Lisa Cline  is a 33 y.o.  year old with history of a 33 cm anterior abdominal wall desmoid tumor in the site of a prior incision.  Therefore our recommendation is to continue exams with MRI and physical exam of the lower abdominal wall in 12 months time.  We will follow Lisa Cline for 3 to 5 years with these kind of exams and provided she remains free of apparent recurrence at that time we will suspend routine scheduled follow-up and instead recommend that she follow-up with her primary care doctor on an as-needed basis if she develops new symptoms of abdominal mass.  HPI: Lisa Cline is a very pleasant 33 year old nulliparous woman who is here in consultation at the request of Dr. Ihor Dow for a large abdominopelvic mass causing bilateral ureteral compression.  The patient has a remote history of a left salpingo-oophorectomy and right ovarian cystectomy in 2016 via a low transverse incision for large bilateral dermoid cysts.  She did well after that surgery but began developing symptoms of increasing abdominal girth bloating and early satiety that were most notable in the early part of 2019.  This prompted her undergoing a CT scan of the abdomen and pelvis at no font which revealed a giant abdominopelvic mass measuring 22 x 25 x 13 cm that is predominantly solid and may arrives from the left ovary and extends from the pelvis into the abdomen above the umbilicus.  The anterior uterus is lobular suggesting a fibroid.  There is an apparent right ovarian dermoid containing cyst, fat, and calcification and measures 10 x 7 cm.  There is no pelvic free fluid or definite adenopathy.  There is compression of the ureters and colon causing mild bilateral hydronephrosis.  The  patient is otherwise exceptionally healthy.  She has never been pregnant but desires future fertility.  She is engaged to be married.  Her last Pap smear was unremarkable with no high risk HPV in May 2019.  She takes no routine medications.  Her any prior surgery is the one listed above.  Her only prior family history for malignancy is a maternal grandmother with breast cancer in her 13s.  MRI pelvis and abdomen was performed on 08/01/17 and revealed marked interval enlargement of the previously described anterior pelvic mass, favored to arise from the left ovary. This was consistent with a dermoid on the prior exam. Given interval enlargement and altered morphology, malignant transformation is a concern. The more posterior pelvic mass, favored to arise from the right ovary, is grossly similar to on the prior. Right hydronephrosis is mild and presumably secondary to mass effect.  On 08/09/17 she underwent an exploratory laparotomy with resection of an abdominal wall mass, partial  resection of rectus muscle, sigmoid colectomy with side-to-side stapled anastomosis, subserosal myomectomy, right ovarian cystectomy.  The procedure was complicated by an EBL of thousand cc requiring use of intraoperative Cell Saver transfusion.  Postoperatively she experienced acute blood loss anemia but did not require additional transfusions.  Intraoperative frozen section revealed a 30 cm solid vascular mass densely adherent to an infiltrating into or arising from the inferior rectus muscle bellies.  It was densely adherent to the dome of the bladder and the peritoneum but not infiltrating into the detrusor muscle.  It  was densely adherent to a 10 cm segment of sigmoid colon at the level of the pelvic brim.  The mass was not adherent to or involving the uterus or right ovary.  The left ovary was surgically absent.  The uterus contained a 3 cm subserosal fundal fibroid.  The right ovary contained a 7 cm cystic mass which was also  separate to the largest solid mass.  The appendix cecum transverse colon descending colon and small intestines were all normal palpably and visibly.  The diaphragm was smooth.  There was no residual tumor remaining at the completion of the surgery.  Frozen section returned as a myxoid tumor without clear malignancy but suspicious for sarcoma.  It did not appear consistent with a leiomyosarcoma.  Postoperatively she did well and was discharged on postoperative day 2.    Final pathology revealed a 27.5cm desmoid tumor weighing 4.5 kg.  The separate resection of the right rectus belly was positive for desmoid tumor.  The right ovarian cyst was positive for a benign dermoid cyst.  The myomectomy specimen was significant for benign leiomyoma.  Her case was presented at GI tumor board in the presence of surgical oncology, genetics, and GI medical oncology.  Her case and pathology and imaging were reviewed.  It was felt that the best course of action was referral to genetics for consideration of underlying Gardner syndrome (FAP), and then surveillance with annual physical examinations of the low abdominal wall given the potential for recurrence.  Consideration for annual MRI imaging in the early surveillance.  Is reasonable.  Interval Hx: MRI of the pelvis on December 16, 2018 showed no evidence of recurrent mass in the visualized ventral abdominal pelvic wall. She has keloid in her scar (upper aspect) but no other concerns or complaints.    Current Meds:  Outpatient Encounter Medications as of 12/20/2018  Medication Sig  . buPROPion (WELLBUTRIN XL) 150 MG 24 hr tablet Take by mouth.  . [DISCONTINUED] docusate sodium (COLACE) 100 MG capsule Take 100 mg by mouth daily.  . [DISCONTINUED] enoxaparin (LOVENOX) 40 MG/0.4ML injection Inject 0.4 mLs (40 mg total) into the skin daily for 26 doses.  . [DISCONTINUED] Multiple Vitamin (MULTIVITAMIN) tablet Take 1 tablet by mouth daily.  . [DISCONTINUED] vitamin E  600 UNIT capsule Take 600 Units by mouth daily.   No facility-administered encounter medications on file as of 12/20/2018.     Allergy: No Known Allergies  Social Hx:   Social History   Socioeconomic History  . Marital status: Single    Spouse name: Not on file  . Number of children: Not on file  . Years of education: Not on file  . Highest education level: Not on file  Occupational History  . Not on file  Social Needs  . Financial resource strain: Not on file  . Food insecurity    Worry: Not on file    Inability: Not on file  . Transportation needs    Medical: Not on file    Non-medical: Not on file  Tobacco Use  . Smoking status: Never Smoker  . Smokeless tobacco: Never Used  Substance and Sexual Activity  . Alcohol use: Yes    Comment: socially  . Drug use: Never  . Sexual activity: Yes    Birth control/protection: Condom  Lifestyle  . Physical activity    Days per week: Not on file    Minutes per session: Not on file  . Stress: Not on file  Relationships  .  Social Herbalist on phone: Not on file    Gets together: Not on file    Attends religious service: Not on file    Active member of club or organization: Not on file    Attends meetings of clubs or organizations: Not on file    Relationship status: Not on file  . Intimate partner violence    Fear of current or ex partner: Not on file    Emotionally abused: Not on file    Physically abused: Not on file    Forced sexual activity: Not on file  Other Topics Concern  . Not on file  Social History Narrative  . Not on file    Past Surgical Hx:  Past Surgical History:  Procedure Laterality Date  . COLON RESECTION SIGMOID  08/09/2017   Procedure: COLON RESECTION SIGMOID W SIDE TO SIDE ANASTOMOSIS;  Surgeon: Everitt Amber, MD;  Location: [facility];  Service: Gynecology;;  . LAPAROTOMY N/A 04/23/2014   Procedure: LAPAROTOMY;  Surgeon: Lavonia Drafts, MD;  Location: [facility];  Service: Gynecology;   Laterality: N/A;  . LAPAROTOMY N/A 08/09/2017   Procedure: EXPLORATORY LAPAROTOMY;  Surgeon: Everitt Amber, MD;  Location: [facility];  Service: Gynecology;  Laterality: N/A;  . MASS EXCISION N/A 08/09/2017   Procedure: EXCISION OF PELVIC MASS;  Surgeon: Everitt Amber, MD;  Location: [facility];  Service: Gynecology;  Laterality: N/A;  . MYOMECTOMY N/A 08/09/2017   Procedure: POSSIBLE MYOMECTOMY;  Surgeon: Everitt Amber, MD;  Location: [facility];  Service: Gynecology;  Laterality: N/A;  . NO PAST SURGERIES    . OVARIAN CYST REMOVAL Left 04/23/2014   Procedure: OVARIAN CYSTECTOMY;  Surgeon: Lavonia Drafts, MD;  Location: [facility];  Service: Gynecology;  Laterality: Left;  . OVARIAN CYST REMOVAL Right 08/09/2017   Procedure: RIGHT OVARIAN CYSTECTOMY;  Surgeon: Everitt Amber, MD;  Location: [facility];  Service: Gynecology;  Laterality: Right;  . SALPINGOOPHORECTOMY Right 04/23/2014   Procedure: SALPINGO OOPHORECTOMY;  Surgeon: Lavonia Drafts, MD;  Location: [facility];  Service: Gynecology;  Laterality: Right;    Past Medical Hx:  Past Medical History:  Diagnosis Date  . Desmoid tumor of abdomen   . Family history of breast cancer   . Family history of colonic polyps   . Family history of uterine cancer     Past Gynecological History:  G0 No LMP recorded.  Family Hx:  Family History  Problem Relation Age of Onset  . Hypertension Father   . Hypertension Mother   . Heart attack Mother 79  . Hypertension Brother   . Breast cancer Maternal Grandmother 50       d. 3  . Heart attack Maternal Grandfather   . Heart attack Paternal Grandmother   . Heart attack Paternal Grandfather   . Uterine cancer Paternal Aunt 67  . Uterine cancer Other        PGFs sister  . Colon polyps Cousin        pat first cousin with multiple polyps    Review of Systems:  Constitutional  Feels well,    ENT Normal appearing ears and nares bilaterally Skin/Breast  No rash, sores, jaundice, itching,  dryness Cardiovascular  No chest pain, shortness of breath, or edema  Pulmonary  No cough or wheeze.  Gastro Intestinal  No nausea, vomitting, or diarrhoea. No bright red blood per rectum, no abdominal pain, change in bowel movement, or constipation. no early satiety, no distension. Genito Urinary  No frequency, urgency, dysuria,  Musculo Skeletal  No myalgia, arthralgia, joint swelling or pain  Neurologic  No weakness, numbness, change in gait,  Psychology  No depression, anxiety, insomnia.   Vitals:  Blood pressure 135/88, pulse 72, temperature 98.2 F (36.8 C), temperature source Temporal, resp. rate 17, height 5\' 10"  (1.778 m), weight 185 lb (83.9 kg), SpO2 100 %.  Physical Exam: WD in NAD Neck  Supple NROM, without any enlargements.  Lymph Node Survey No cervical supraclavicular or inguinal adenopathy Cardiovascular  Pulse normal rate, regularity and rhythm. S1 and S2 normal.  Lungs  Clear to auscultation bilateraly, without wheezes/crackles/rhonchi. Good air movement.  Skin  No rash/lesions/breakdown  Psychiatry  Alert and oriented to person, place, and time  Abdomen  Normoactive bowel sounds, abdomen soft, non-tender and nonobese without evidence of hernia. No palpable masses. Well healed vertical paramedian incision with keloid in upper incision. No inflammation or infection. No palpable tumor in lower abdomen. Back No CVA tenderness Genito Urinary  No palpable masses, normal mobile uterus.  Rectal  deferred Extremities  No bilateral cyanosis, clubbing or edema.   Thereasa Solo, MD  12/20/2018, 5:13 PM

## 2018-12-20 NOTE — Patient Instructions (Signed)
Dr Serita Grit office will call you with your MRI appointment for October, 2021.  Please contact Dr Serita Grit office (at [phone]) in July 2021 to request an appointment with her for October, 2021 after your MRI.

## 2018-12-25 ENCOUNTER — Telehealth: Payer: Self-pay | Admitting: *Deleted

## 2018-12-25 NOTE — Telephone Encounter (Signed)
Called and left the patient a message stating I would call her the end of September next year to schedule both her scan and MD appt

## 2018-12-27 ENCOUNTER — Encounter: Payer: Self-pay | Admitting: Obstetrics & Gynecology

## 2018-12-27 ENCOUNTER — Other Ambulatory Visit: Payer: Self-pay

## 2018-12-27 ENCOUNTER — Ambulatory Visit (INDEPENDENT_AMBULATORY_CARE_PROVIDER_SITE_OTHER): Payer: 59 | Admitting: Obstetrics & Gynecology

## 2018-12-27 VITALS — BP 138/96 | HR 72 | Ht 70.0 in | Wt 185.0 lb

## 2018-12-27 DIAGNOSIS — Z1151 Encounter for screening for human papillomavirus (HPV): Secondary | ICD-10-CM | POA: Diagnosis not present

## 2018-12-27 DIAGNOSIS — Z01419 Encounter for gynecological examination (general) (routine) without abnormal findings: Secondary | ICD-10-CM | POA: Diagnosis not present

## 2018-12-27 DIAGNOSIS — Z124 Encounter for screening for malignant neoplasm of cervix: Secondary | ICD-10-CM | POA: Diagnosis not present

## 2018-12-27 DIAGNOSIS — R030 Elevated blood-pressure reading, without diagnosis of hypertension: Secondary | ICD-10-CM

## 2018-12-27 NOTE — Progress Notes (Signed)
Subjective:     Lisa Cline is a 33 y.o. female here for a routine exam. G0 Current complaints: Pt is s/p resection of desmoid tumor by Dr. Denman George 08/09/2017.  Pt denies problems. She is engaged. Sexually active. She is not interested in getting pregnant at this point in time. She is not sure re children in the future.     Pt bought a house in [place] and now works there. Recently got a new dog during [place] pandemic!   Gynecologic History Patient's last menstrual period was 12/12/2018 (exact date). Contraception:  Last Pap: 07/20/2017. Results were: normal Last mammogram: n/a.  Obstetric History OB History  Gravida Para Term Preterm AB Living  0 0 0 0 0 0  SAB TAB Ectopic Multiple Live Births  0 0 0 0     The following portions of the patient's history were reviewed and updated as appropriate: allergies, current medications, past family history, past medical history, past social history, past surgical history and problem list.  Review of Systems Pertinent items are noted in HPI.    Objective:  BP (!) 143/100 (BP Location: Left Arm, Patient Position: Sitting)   Pulse 75   Ht 5\' 10"  (1.778 m)   Wt 185 lb (83.9 kg)   LMP 12/12/2018 (Exact Date)   BMI 26.54 kg/m   General Appearance:    Alert, cooperative, no distress, appears stated age  Head:    Normocephalic, without obvious abnormality, atraumatic  Eyes:    conjunctiva/corneas clear, EOM's intact, both eyes  Ears:    Normal external ear canals, both ears  Nose:   Nares normal, septum midline, mucosa normal, no drainage    or sinus tenderness  Throat:   Lips, mucosa, and tongue normal; teeth and gums normal  Neck:   Supple, symmetrical, trachea midline, no adenopathy;    thyroid:  no enlargement/tenderness/nodules  Back:     Symmetric, no curvature, ROM normal, no CVA tenderness  Lungs:     respirations unlabored  Chest Wall:    No tenderness or deformity   Heart:    Regular rate and rhythm  Breast Exam:    No  tenderness, masses, or nipple abnormality  Abdomen:     Soft, non-tender, bowel sounds active all four quadrants,    no masses, no organomegaly  Genitalia:    Normal female without lesion, discharge or tenderness     Extremities:   Extremities normal, atraumatic, no cyanosis or edema  Pulses:   2+ and symmetric all extremities  Skin:   Skin color, texture, turgor normal, no rashes or lesions    Assessment:    Healthy female exam.   Elevated BP   Plan:    f/u PAP with [facility]PV F/u in 1 year or sooner prn for GYN exam  Needs repeat BP in 2 weeks, pt will f/u with her [facility] primary care provider.   Geovani Tootle L. Harraway-Smith, M.D., [name]

## 2019-01-03 LAB — CYTOLOGY - PAP
Comment: NEGATIVE
Diagnosis: NEGATIVE
High risk HPV: NEGATIVE

## 2019-11-20 ENCOUNTER — Telehealth: Payer: Self-pay | Admitting: *Deleted

## 2019-11-20 NOTE — Telephone Encounter (Signed)
Called [facility] to scheduled the MRI for the patient. [facility] stated that they will call the patient and schedule the scan since the patient will needed to be screened.

## 2019-11-20 NOTE — Telephone Encounter (Signed)
Patient scheduled for MRI at [facility] on 10/20. Patient scheduled for a follow up visit with Dr Denman George on 11/5

## 2019-11-22 ENCOUNTER — Encounter: Payer: Self-pay | Admitting: Gynecologic Oncology

## 2019-11-22 NOTE — Progress Notes (Signed)
Called and spoke with representative, Mr. Lisa Cline, with [facility] at the request of our prior auth department for physician to physician review for MRI.  He said the MRI is approved. Auth # is [id number] valid 11/21/2019 to 01/05/2020. Prior auth department notified [facility]).

## 2019-12-12 ENCOUNTER — Other Ambulatory Visit: Payer: Self-pay

## 2019-12-12 ENCOUNTER — Ambulatory Visit
Admission: RE | Admit: 2019-12-12 | Discharge: 2019-12-12 | Disposition: A | Discharge status: Home / Self Care | Payer: 59 | Source: Ambulatory Visit | Attending: Gynecologic Oncology | Admitting: Gynecologic Oncology

## 2019-12-12 DIAGNOSIS — D481 Neoplasm of uncertain behavior of connective and other soft tissue: Secondary | ICD-10-CM

## 2019-12-12 MED ORDER — GADOBENATE DIMEGLUMINE 529 MG/ML IV SOLN
17.0000 mL | Freq: Once | INTRAVENOUS | Status: AC | PRN
  Administered 2019-12-12: 17 mL via INTRAVENOUS

## 2019-12-13 ENCOUNTER — Telehealth: Payer: Self-pay

## 2019-12-13 NOTE — Telephone Encounter (Signed)
TC to patient to convey no evidence of recurrence is seen on MRI.  Lisa Cline pleased with results.  Patient reminded of appointment with Dr. Denman George on 12/28/19.  Patient verbalized understanding of all information provided.

## 2019-12-28 ENCOUNTER — Inpatient Hospital Stay: Payer: 59 | Attending: Gynecologic Oncology | Admitting: Gynecologic Oncology

## 2019-12-28 ENCOUNTER — Encounter: Payer: Self-pay | Admitting: Gynecologic Oncology

## 2019-12-28 ENCOUNTER — Other Ambulatory Visit: Payer: Self-pay

## 2019-12-28 VITALS — BP 128/76 | HR 70 | Temp 98.0°F | Resp 18 | Ht 70.0 in | Wt 186.0 lb

## 2019-12-28 DIAGNOSIS — N83201 Unspecified ovarian cyst, right side: Secondary | ICD-10-CM | POA: Diagnosis not present

## 2019-12-28 DIAGNOSIS — D252 Subserosal leiomyoma of uterus: Secondary | ICD-10-CM

## 2019-12-28 DIAGNOSIS — D481 Neoplasm of uncertain behavior of connective and other soft tissue: Secondary | ICD-10-CM

## 2019-12-28 NOTE — Progress Notes (Signed)
Follow-up Note: Gyn-Onc  Consult was requested by Dr. Ihor Dow for the evaluation of Lisa Cline 34 y.o. female  CC:  Chief Complaint  Patient presents with  . Pelvic mass in female    Assessment/Plan:  Lisa Cline  is a 34 y.o.  year old with a history of a 27 cm anterior abdominal wall desmoid tumor in the site of a prior incision excised in June, 2019.  Our recommendation is to continue exams with MRI and physical exam of the lower abdominal wall in 12 months time.  We will follow Lisa Cline for 3 to 5 years with these kind of exams and provided she remains free of apparent recurrence at that time we will suspend routine scheduled follow-up and instead recommend that she follow-up with her primary care doctor on an as-needed basis if she develops new symptoms of abdominal mass.  HPI: Lisa Cline is a very pleasant 34 year old nulliparous woman who was initially seen at the request of Dr. Ihor Dow for a large abdominopelvic mass causing bilateral ureteral compression.  The patient had a remote history of a left salpingo-oophorectomy and right ovarian cystectomy in 2016 via a low transverse incision for large bilateral dermoid cysts.  She did well after that surgery but began developing symptoms of increasing abdominal girth bloating and early satiety that were most notable in the early part of 2019.  This prompted her undergoing a CT scan of the abdomen and pelvis at no font which revealed a giant abdominopelvic mass measuring 22 x 25 x 13 cm that was predominantly solid. The anterior uterus was lobular suggesting a fibroid.  There was an apparent right ovarian dermoid containing cyst, fat, and calcification and measures 10 x 7 cm.  There was compression of the ureters and colon causing mild bilateral hydronephrosis.  She had never been pregnant but desired future fertility.    Preoperative MRI pelvis and abdomen was performed on 08/01/17 and revealed marked  interval enlargement of the previously described anterior pelvic mass, favored to arise from the left ovary. This was consistent with a dermoid on the prior exam. Given interval enlargement and altered morphology, malignant transformation is a concern. The more posterior pelvic mass, favored to arise from the right ovary, is grossly similar to on the prior. Right hydronephrosis is mild and presumably secondary to mass effect.  On 08/09/17 she underwent an exploratory laparotomy with resection of an abdominal wall mass, partial  resection of rectus muscle, sigmoid colectomy with side-to-side stapled anastomosis, subserosal myomectomy, right ovarian cystectomy.  The procedure was complicated by an EBL of 1000cc requiring use of intraoperative Cell Saver transfusion.  Postoperatively she experienced acute blood loss anemia but did not require additional transfusions.  Intraoperative frozen section revealed a 30 cm solid vascular mass densely adherent to an infiltrating into or arising from the inferior rectus muscle bellies.  It was densely adherent to the dome of the bladder and the peritoneum but not infiltrating into the detrusor muscle.  It was densely adherent to a 10 cm segment of sigmoid colon at the level of the pelvic brim.  The mass was not adherent to or involving the uterus or right ovary.  The left ovary was surgically absent.  The uterus contained a 3 cm subserosal fundal fibroid.  The right ovary contained a 7 cm cystic mass which was also separate to the largest solid mass.  The appendix cecum transverse colon descending colon and small intestines were all normal palpably and visibly.  The  diaphragm was smooth.  There was no residual tumor remaining at the completion of the surgery.  Frozen section returned as a myxoid tumor without clear malignancy but suspicious for sarcoma.  It did not appear consistent with a leiomyosarcoma.  Final pathology revealed a 27.5cm desmoid tumor weighing 4.5 kg.  The  separate resection of the right rectus belly was positive for desmoid tumor.  The right ovarian cyst was positive for a benign dermoid cyst.  The myomectomy specimen was significant for benign leiomyoma.  Her case was presented at GI tumor board in the presence of surgical oncology, genetics, and GI medical oncology.  Her case and pathology and imaging were reviewed.  It was felt that the best course of action was referral to genetics for consideration of underlying Gardner syndrome (FAP), and then surveillance with annual physical examinations of the low abdominal wall given the potential for recurrence.  Consideration for annual MRI imaging in the early surveillance was felt to be reasonable.  MRI of the pelvis on December 16, 2018 showed no evidence of recurrent mass in the visualized ventral abdominal pelvic wall.  Interval Hx:  She has keloid in her scar (upper aspect) but no other concerns or complaints.    Current Meds:  Outpatient Encounter Medications as of 12/28/2019  Medication Sig  . Vitamin D, Cholecalciferol, 10 MCG (400 UNIT) CHEW Chew 10 mcg by mouth daily.  . [DISCONTINUED] buPROPion (WELLBUTRIN XL) 150 MG 24 hr tablet Take by mouth.   No facility-administered encounter medications on file as of 12/28/2019.    Allergy: No Known Allergies  Social Hx:   Social History   Socioeconomic History  . Marital status: Single    Spouse name: Not on file  . Number of children: Not on file  . Years of education: Not on file  . Highest education level: Not on file  Occupational History  . Not on file  Tobacco Use  . Smoking status: Never Smoker  . Smokeless tobacco: Never Used  Vaping Use  . Vaping Use: Former  Substance and Sexual Activity  . Alcohol use: Yes    Comment: socially  . Drug use: Never  . Sexual activity: Yes    Birth control/protection: Condom  Other Topics Concern  . Not on file  Social History Narrative  . Not on file   Social Determinants of Health    Financial Resource Strain:   . Difficulty of Paying Living Expenses: Not on file  Food Insecurity:   . Worried About Charity fundraiser in the Last Year: Not on file  . Ran Out of Food in the Last Year: Not on file  Transportation Needs:   . Lack of Transportation (Medical): Not on file  . Lack of Transportation (Non-Medical): Not on file  Physical Activity:   . Days of Exercise per Week: Not on file  . Minutes of Exercise per Session: Not on file  Stress:   . Feeling of Stress : Not on file  Social Connections:   . Frequency of Communication with Friends and Family: Not on file  . Frequency of Social Gatherings with Friends and Family: Not on file  . Attends Religious Services: Not on file  . Active Member of Clubs or Organizations: Not on file  . Attends Archivist Meetings: Not on file  . Marital Status: Not on file  Intimate Partner Violence:   . Fear of Current or Ex-Partner: Not on file  . Emotionally Abused: Not on file  .  Physically Abused: Not on file  . Sexually Abused: Not on file    Past Surgical Hx:  Past Surgical History:  Procedure Laterality Date  . COLON RESECTION SIGMOID  08/09/2017   Procedure: COLON RESECTION SIGMOID W SIDE TO SIDE ANASTOMOSIS;  Surgeon: Everitt Amber, MD;  Location: [facility];  Service: Gynecology;;  . LAPAROTOMY N/A 04/23/2014   Procedure: LAPAROTOMY;  Surgeon: Lavonia Drafts, MD;  Location: [facility];  Service: Gynecology;  Laterality: N/A;  . LAPAROTOMY N/A 08/09/2017   Procedure: EXPLORATORY LAPAROTOMY;  Surgeon: Everitt Amber, MD;  Location: [facility];  Service: Gynecology;  Laterality: N/A;  . MASS EXCISION N/A 08/09/2017   Procedure: EXCISION OF PELVIC MASS;  Surgeon: Everitt Amber, MD;  Location: [facility];  Service: Gynecology;  Laterality: N/A;  . MYOMECTOMY N/A 08/09/2017   Procedure: POSSIBLE MYOMECTOMY;  Surgeon: Everitt Amber, MD;  Location: [facility];  Service: Gynecology;  Laterality: N/A;  . NO PAST SURGERIES    . OVARIAN CYST  REMOVAL Left 04/23/2014   Procedure: OVARIAN CYSTECTOMY;  Surgeon: Lavonia Drafts, MD;  Location: [facility];  Service: Gynecology;  Laterality: Left;  . OVARIAN CYST REMOVAL Right 08/09/2017   Procedure: RIGHT OVARIAN CYSTECTOMY;  Surgeon: Everitt Amber, MD;  Location: [facility];  Service: Gynecology;  Laterality: Right;  . SALPINGOOPHORECTOMY Right 04/23/2014   Procedure: SALPINGO OOPHORECTOMY;  Surgeon: Lavonia Drafts, MD;  Location: [facility];  Service: Gynecology;  Laterality: Right;    Past Medical Hx:  Past Medical History:  Diagnosis Date  . Desmoid tumor of abdomen   . Family history of breast cancer   . Family history of colonic polyps   . Family history of uterine cancer     Past Gynecological History:  G0 No LMP recorded.  Family Hx:  Family History  Problem Relation Age of Onset  . Hypertension Father   . Hypertension Mother   . Heart attack Mother 69  . Hypertension Brother   . Breast cancer Maternal Grandmother 50       d. 72  . Heart attack Maternal Grandfather   . Heart attack Paternal Grandmother   . Heart attack Paternal Grandfather   . Uterine cancer Paternal Aunt 65  . Uterine cancer Other        PGFs sister  . Colon polyps Cousin        pat first cousin with multiple polyps    Review of Systems:  Constitutional  Feels well,    ENT Normal appearing ears and nares bilaterally Skin/Breast  No rash, sores, jaundice, itching, dryness Cardiovascular  No chest pain, shortness of breath, or edema  Pulmonary  No cough or wheeze.  Gastro Intestinal  No nausea, vomitting, or diarrhoea. No bright red blood per rectum, no abdominal pain, change in bowel movement, or constipation. no early satiety, no distension. Genito Urinary  No frequency, urgency, dysuria,  Musculo Skeletal  No myalgia, arthralgia, joint swelling or pain  Neurologic  No weakness, numbness, change in gait,  Psychology  No depression, anxiety, insomnia.   Vitals:  Blood pressure  128/76, pulse 70, temperature 98 F (36.7 C), temperature source Tympanic, resp. rate 18, height 5\' 10"  (1.778 m), weight 186 lb (84.4 kg), SpO2 100 %.  Physical Exam: WD in NAD Neck  Supple NROM, without any enlargements.  Lymph Node Survey No cervical supraclavicular or inguinal adenopathy Cardiovascular  Pulse normal rate, regularity and rhythm. S1 and S2 normal.  Lungs  Clear to auscultation bilateraly, without wheezes/crackles/rhonchi. Good air movement.  Skin  No rash/lesions/breakdown  Psychiatry  Alert and oriented to person, place, and time  Abdomen  Normoactive bowel sounds, abdomen soft, non-tender and nonobese without evidence of hernia. No palpable masses. Well healed vertical paramedian incision with keloid/hypertrophic scar in upper incision. No inflammation or infection. No palpable tumor in lower abdomen. Back No CVA tenderness Genito Urinary  No palpable masses, normal mobile uterus.  Rectal  deferred Extremities  No bilateral cyanosis, clubbing or edema.   Thereasa Solo, MD  12/28/2019, 5:10 PM

## 2019-12-28 NOTE — Patient Instructions (Signed)
Dr Denman George is recommending follow-up in 12 months.  Please contact Dr Serita Grit office (at [phone]) in July, 2022 to request an MRI be scheduled and an appointment with her for November, 2022.

## 2020-01-23 ENCOUNTER — Other Ambulatory Visit (HOSPITAL_COMMUNITY)
Admission: RE | Admit: 2020-01-23 | Discharge: 2020-01-23 | Disposition: A | Discharge status: Home / Self Care | Payer: 59 | Source: Ambulatory Visit | Attending: Obstetrics & Gynecology | Admitting: Obstetrics & Gynecology

## 2020-01-23 ENCOUNTER — Other Ambulatory Visit: Payer: Self-pay

## 2020-01-23 ENCOUNTER — Encounter: Payer: Self-pay | Admitting: Obstetrics & Gynecology

## 2020-01-23 ENCOUNTER — Ambulatory Visit (INDEPENDENT_AMBULATORY_CARE_PROVIDER_SITE_OTHER): Payer: 59 | Admitting: Obstetrics & Gynecology

## 2020-01-23 VITALS — BP 120/83 | HR 76 | Ht 70.0 in | Wt 182.1 lb

## 2020-01-23 DIAGNOSIS — L910 Hypertrophic scar: Secondary | ICD-10-CM

## 2020-01-23 DIAGNOSIS — N761 Subacute and chronic vaginitis: Secondary | ICD-10-CM | POA: Insufficient documentation

## 2020-01-23 DIAGNOSIS — Z01419 Encounter for gynecological examination (general) (routine) without abnormal findings: Secondary | ICD-10-CM | POA: Insufficient documentation

## 2020-01-23 MED ORDER — CLOTRIMAZOLE-BETAMETHASONE 1-0.05 % EX CREA: 1.0000 "application " | TOPICAL_CREAM | Freq: Two times a day (BID) | CUTANEOUS | 2 refills | Status: DC

## 2020-01-23 NOTE — Progress Notes (Signed)
Subjective:     Lisa Cline is a 34 y.o. female here for a routine exam.  Current complaints: Pt reports some vulvar irritation. She denies abnormal discharge. No new sexual partners.       Gynecologic History Patient's last menstrual period was 12/30/2019. Last Pap: 12/27/2018. Results were: normal Last mammogram: n/a.   Obstetric History OB History  Gravida Para Term Preterm AB Living  0 0 0 0 0 0  SAB TAB Ectopic Multiple Live Births  0 0 0 0     The following portions of the patient's history were reviewed and updated as appropriate: allergies, current medications, past family history, past medical history, past social history, past surgical history and problem list.  Review of Systems Pertinent items are noted in HPI.    Objective:  BP 120/83   Pulse 76   Ht 5\' 10"  (1.778 m)   Wt 182 lb 1.3 oz (82.6 kg)   LMP 12/30/2019   BMI 26.13 kg/m   General Appearance:    Alert, cooperative, no distress, appears stated age  Head:    Normocephalic, without obvious abnormality, atraumatic  Eyes:    conjunctiva/corneas clear, EOM's intact, both eyes  Ears:    Normal external ear canals, both ears  Nose:   Nares normal, septum midline, mucosa normal, no drainage    or sinus tenderness  Throat:   Lips, mucosa, and tongue normal; teeth and gums normal  Neck:   Supple, symmetrical, trachea midline, no adenopathy;    thyroid:  no enlargement/tenderness/nodules  Back:     Symmetric, no curvature, ROM normal, no CVA tenderness  Lungs:     respirations unlabored  Chest Wall:    No tenderness or deformity   Heart:    Regular rate and rhythm  Breast Exam:    No tenderness, masses, or nipple abnormality  Abdomen:     Soft, non-tender, bowel sounds active all four quadrants,    no masses, no organomegaly  Genitalia:    Normal female without lesion, discharge or tenderness     Extremities:   Extremities normal, atraumatic, no cyanosis or edema  Pulses:   2+ and symmetric all  extremities  Skin:   Skin color, texture, turgor normal, no rashes or lesions   Procedure: Pt eval for keloid of surgical incision.  Area cleaned with alcohol.  1cc of Kenalog 40 + 2cc of Lidocaine 2% was injected into the keloid.  There were no complications. Pt instructed about s/sx of infection.  She tolerated the procedure well.  She will f/u in 2 week for repeat injection.   Assessment:    Healthy female exam.   Keloid- s/p intralesional injection of steroid  Vaginitis      Plan:     Lisa Cline was seen today for gynecologic exam.  Diagnoses and all orders for this visit:  Subacute vaginitis -     clotrimazole-betamethasone (LOTRISONE) cream; Apply 1 application topically 2 (two) times daily. -     Cervicovaginal ancillary only( [place])  Well female exam with routine gynecological exam -     Cervicovaginal ancillary only( [place])  Scarring, keloid  F/u in 2 weeks for repeat injection or sooner  Lisa Cline, M.D., [name]

## 2020-01-25 LAB — CERVICOVAGINAL ANCILLARY ONLY
Candida Glabrata: NEGATIVE
Candida Vaginitis: POSITIVE — AB
Comment: NEGATIVE
Comment: NEGATIVE

## 2020-02-05 ENCOUNTER — Other Ambulatory Visit: Payer: Self-pay | Admitting: Obstetrics & Gynecology

## 2020-02-05 DIAGNOSIS — B3731 Acute candidiasis of vulva and vagina: Secondary | ICD-10-CM

## 2020-02-05 MED ORDER — FLUCONAZOLE 150 MG PO TABS
150.0000 mg | ORAL_TABLET | Freq: Once | ORAL | 0 refills | Status: AC
Start: 2020-02-05 — End: 2020-02-05

## 2020-02-08 ENCOUNTER — Ambulatory Visit (INDEPENDENT_AMBULATORY_CARE_PROVIDER_SITE_OTHER): Payer: 59 | Admitting: Obstetrics & Gynecology

## 2020-02-08 ENCOUNTER — Encounter: Payer: Self-pay | Admitting: Obstetrics & Gynecology

## 2020-02-08 ENCOUNTER — Other Ambulatory Visit: Payer: Self-pay

## 2020-02-08 VITALS — BP 131/89 | HR 68 | Wt 182.0 lb

## 2020-02-08 DIAGNOSIS — L910 Hypertrophic scar: Secondary | ICD-10-CM | POA: Diagnosis not present

## 2020-02-11 ENCOUNTER — Encounter: Payer: Self-pay | Admitting: Obstetrics & Gynecology

## 2020-02-11 DIAGNOSIS — L910 Hypertrophic scar: Secondary | ICD-10-CM | POA: Insufficient documentation

## 2020-02-11 NOTE — Progress Notes (Signed)
Pt seen for keloid on vertical surgical incision. This is her second treatment.  Area cleaned with alcohol.  1cc of Kenalog 40 + Lidocaine 2% was injected into the keloid.  There were no complications. Pt instructed about s/sx of infection.  She tolerated the procedure well.  She will f/u in 2 weeks for repeat injection.  Elma Shands L. Harraway-Smith, M.D., [name]

## 2020-02-25 ENCOUNTER — Ambulatory Visit (INDEPENDENT_AMBULATORY_CARE_PROVIDER_SITE_OTHER): Payer: 59 | Admitting: Obstetrics & Gynecology

## 2020-02-25 ENCOUNTER — Encounter: Payer: Self-pay | Admitting: Obstetrics & Gynecology

## 2020-02-25 ENCOUNTER — Other Ambulatory Visit: Payer: Self-pay

## 2020-02-25 DIAGNOSIS — L910 Hypertrophic scar: Secondary | ICD-10-CM | POA: Diagnosis not present

## 2020-02-25 NOTE — Progress Notes (Signed)
Pt seen for eval of vertical abd incision. This is her 3rd treatment and the keloid is improved. Area cleaned with alcohol.  2cc of Kenalog 40 + 1cc of Lidocaine 2% was injected into the keloid.  There were no complications. Pt instructed about s/sx of infection.  She tolerated the procedure well.  She will f/u in 2 weeks for repeat injection.  Ellinore Merced L. Harraway-Smith, M.D., Evern Core

## 2020-03-14 ENCOUNTER — Other Ambulatory Visit: Payer: Self-pay

## 2020-03-14 ENCOUNTER — Encounter: Payer: Self-pay | Admitting: Obstetrics & Gynecology

## 2020-03-14 ENCOUNTER — Ambulatory Visit (INDEPENDENT_AMBULATORY_CARE_PROVIDER_SITE_OTHER): Payer: Self-pay | Admitting: Obstetrics & Gynecology

## 2020-03-14 VITALS — BP 139/87 | HR 66 | Wt 185.0 lb

## 2020-03-14 DIAGNOSIS — L910 Hypertrophic scar: Secondary | ICD-10-CM

## 2020-03-14 NOTE — Progress Notes (Signed)
Pt seen for eval of vertical abd incision. This is her 4th treatment and the keloid is improved. Area cleaned with alcohol.  2cc of Kenalog 40 + 1cc of Lidocaine 2% was injected into the keloid.  There were no complications. Pt instructed about s/sx of infection.  She tolerated the procedure well.  She will f/u in 2 weeks for repeat injection.  Jazzy Parmer L. Harraway-Smith, M.D., [name]

## 2020-03-24 ENCOUNTER — Ambulatory Visit (INDEPENDENT_AMBULATORY_CARE_PROVIDER_SITE_OTHER): Payer: BC Managed Care – PPO | Admitting: Obstetrics & Gynecology

## 2020-03-24 ENCOUNTER — Other Ambulatory Visit: Payer: Self-pay

## 2020-03-24 ENCOUNTER — Encounter: Payer: Self-pay | Admitting: Obstetrics & Gynecology

## 2020-03-24 VITALS — BP 140/99 | HR 58 | Wt 187.0 lb

## 2020-03-24 DIAGNOSIS — L910 Hypertrophic scar: Secondary | ICD-10-CM | POA: Diagnosis not present

## 2020-03-24 NOTE — Progress Notes (Signed)
History:  35 y.o. G0P0000 here today for eval of keloid at vertical abd incision site.This is her 5th treatment. The keloid was improving but, the last injection caused pain and she reports 'rubbing the keloid a lot.'  "lightly' and is concerned about hte appearance.   The following portions of the patient's history were reviewed and updated as appropriate: allergies, current medications, past family history, past medical history, past social history, past surgical history and problem list.  Review of Systems:  Pertinent items are noted in HPI.    Objective:  Physical Exam Blood pressure (!) 140/99, pulse (!) 58, weight 187 lb (84.8 kg), last menstrual period 02/23/2020.  CONSTITUTIONAL: Well-developed, well-nourished female in no acute distress.  HENT:  Normocephalic, atraumatic EYES: Conjunctivae and EOM are normal. No scleral icterus.  NECK: Normal range of motion SKIN: Skin is warm and dry. No rash noted. Not diaphoretic.No pallor. [place]: Alert and oriented to person, place, and time. Normal coordination.  Abd: Soft, nontender and nondistended  Procedure Area cleaned with alcohol.2cc of Kenalog 40 +1cc ofLidocaine 2% was injected into the keloid. There were no complications. Pt instructed about s/sx of infection. She tolerated the procedure well.   Assessment & Plan:  Surgical scar keloid  She will f/u in 2weeks for repeat injection.  F/u sooner prn  Pt counseled not to interact with keloid as much as possible  Educated pt about skin trama and development of keloids.   Carolyn L. Harraway-Smith, M.D., [name]

## 2020-04-07 ENCOUNTER — Ambulatory Visit: Payer: BC Managed Care – PPO | Admitting: Obstetrics & Gynecology

## 2020-04-14 ENCOUNTER — Ambulatory Visit (INDEPENDENT_AMBULATORY_CARE_PROVIDER_SITE_OTHER): Payer: BC Managed Care – PPO | Admitting: Obstetrics & Gynecology

## 2020-04-14 ENCOUNTER — Other Ambulatory Visit: Payer: Self-pay

## 2020-04-14 ENCOUNTER — Encounter: Payer: Self-pay | Admitting: Obstetrics & Gynecology

## 2020-04-14 VITALS — BP 137/80 | HR 72 | Ht 70.0 in | Wt 184.0 lb

## 2020-04-14 DIAGNOSIS — N939 Abnormal uterine and vaginal bleeding, unspecified: Secondary | ICD-10-CM

## 2020-04-14 DIAGNOSIS — Z3202 Encounter for pregnancy test, result negative: Secondary | ICD-10-CM

## 2020-04-14 DIAGNOSIS — L910 Hypertrophic scar: Secondary | ICD-10-CM | POA: Diagnosis not present

## 2020-04-14 LAB — POCT URINE PREGNANCY: Preg Test, Ur: NEGATIVE

## 2020-04-14 NOTE — Addendum Note (Signed)
Addended by: Valentina Lucks on: 04/14/2020 09:35 AM   Modules accepted: Orders

## 2020-04-14 NOTE — Progress Notes (Signed)
History:  35 y.o. G0P0000 here today for eval of keloid. Since her last visit pt reports that she wore some pants over the keloid and it developed a scab. She missed the last visit and it cont to heal. She reports that her last cycles was 9 days. It is usually only 4 days but, comes every 24-25 days. She reports only being sexually active once during that time and she used condoms. She reports that the cycle was not heavy but, had old blood.      The following portions of the patient's history were reviewed and updated as appropriate: allergies, current medications, past family history, past medical history, past social history, past surgical history and problem list.  Review of Systems:  Pertinent items are noted in HPI.    Objective:  Physical Exam Blood pressure 137/80, pulse 72, height 5\' 10"  (1.778 m), weight 184 lb 0.6 oz (83.5 kg).  CONSTITUTIONAL: Well-developed, well-nourished female in no acute distress.  HENT:  Normocephalic, atraumatic EYES: Conjunctivae and EOM are normal. No scleral icterus.  NECK: Normal range of motion SKIN: Skin is warm and dry. No rash noted. Not diaphoretic.No pallor. [place]: Alert and oriented to person, place, and time. Normal coordination.  Abd: Soft, nontender and nondistended. Midline incision has keloid. In the center of the keloid there is a scabbed over area in the center. There is no erythema or drainage. This appears to be an old scab. Not tender.    Labs and Imaging UPT: neg  Assessment & Plan:  Keloid-  NOT injected today as there is breakdown of the skin. Will allow to heal and reeval in 4 weeks  AUB-  Reviewed wit pt DDx  Will observe for now.   UPT neg.   Will commence exam on next visit. D/w pt obtain Cx and checking for polyps.   Lisa Cline, M.D., Lisa Cline

## 2020-05-05 ENCOUNTER — Other Ambulatory Visit: Payer: Self-pay

## 2020-05-05 ENCOUNTER — Ambulatory Visit (INDEPENDENT_AMBULATORY_CARE_PROVIDER_SITE_OTHER): Payer: BC Managed Care – PPO | Admitting: Obstetrics & Gynecology

## 2020-05-05 ENCOUNTER — Encounter: Payer: Self-pay | Admitting: Obstetrics & Gynecology

## 2020-05-05 VITALS — BP 136/86 | HR 72 | Wt 188.0 lb

## 2020-05-05 DIAGNOSIS — L910 Hypertrophic scar: Secondary | ICD-10-CM | POA: Diagnosis not present

## 2020-05-05 NOTE — Progress Notes (Signed)
Patient presents for post op for wound check. Kathrene Alu RN

## 2020-05-05 NOTE — Progress Notes (Signed)
History:  35 y.o. G0P0000 here today for eval of incision. Pt noted breakdown in the incision after working out soon after the steroid injection. She felt that the pants that she had on was rubbing the area. She noted a scab and a 'hole' and she was worried. She reports that she has not been putting anything on the incision.   The following portions of the patient's history were reviewed and updated as appropriate: allergies, current medications, past family history, past medical history, past social history, past surgical history and problem list.  Review of Systems:  Pertinent items are noted in HPI.    Objective:  Physical Exam Blood pressure 136/86, pulse 72, weight 188 lb (85.3 kg).  CONSTITUTIONAL: Well-developed, well-nourished female in no acute distress.  HENT:  Normocephalic, atraumatic EYES: Conjunctivae and EOM are normal. No scleral icterus.  NECK: Normal range of motion SKIN: Skin is warm and dry. No rash noted. Not diaphoretic.No pallor. NEUROLGIC: Alert and oriented to person, place, and time. Normal coordination.  Abd: Soft, nontender and nondistended; incision: there is a scab in the midline incision above the umbilicus. There is a small area that has a defect where the scab is detached. This is very shallow and goes to the normal  skin line below the keloid. There are not s/sx of infection.     Assessment & Plan:  Keloid scar.   Healing and scab after skin breakdown  rec keep area clean and dry  F/u in 2 weeks or sooner prn  Shamica Moree L. Harraway-Smith, M.D., Evern Core

## 2020-05-12 ENCOUNTER — Ambulatory Visit: Payer: BC Managed Care – PPO | Admitting: Obstetrics & Gynecology

## 2020-05-21 ENCOUNTER — Ambulatory Visit (INDEPENDENT_AMBULATORY_CARE_PROVIDER_SITE_OTHER): Payer: BC Managed Care – PPO | Admitting: Obstetrics & Gynecology

## 2020-05-21 ENCOUNTER — Encounter: Payer: Self-pay | Admitting: Obstetrics & Gynecology

## 2020-05-21 VITALS — BP 142/77 | HR 70 | Wt 181.0 lb

## 2020-05-21 DIAGNOSIS — L910 Hypertrophic scar: Secondary | ICD-10-CM | POA: Diagnosis not present

## 2020-05-21 DIAGNOSIS — F4321 Adjustment disorder with depressed mood: Secondary | ICD-10-CM | POA: Diagnosis not present

## 2020-05-21 NOTE — Progress Notes (Signed)
History:  35 y.o. G0P0000 here today for eval of keloid incision. The keloid has imporved and the scab fell off but, expressed concern that it may never heal. She expressed frustration with her current job. She has had fears of losing her job and therefore her home etc. She reports that she is behind on tasks and is worried about burnout. She has an appt with her primary care provider later this week.   The following portions of the patient's history were reviewed and updated as appropriate: allergies, current medications, past family history, past medical history, past social history, past surgical history and problem list.  Review of Systems:  Pertinent items are noted in HPI.    Objective:  Physical Exam Blood pressure (!) 142/77, pulse 70, weight 181 lb (82.1 kg), last menstrual period 05/11/2020.  CONSTITUTIONAL: Well-developed, well-nourished female in no acute distress.  HENT:  Normocephalic, atraumatic EYES: Conjunctivae and EOM are normal. No scleral icterus.  NECK: Normal range of motion SKIN: Skin is warm and dry. No rash noted. Not diaphoretic.No pallor. [place]: Alert and oriented to person, place, and time. Normal coordination.  Abd: Soft, nontender and nondistended; the midline keloid is much flatter than prev. The hyperpigmentation is still present and is permanent. Not related to the steroid injections. The area in the midline that separated is healing well. The scab is gone and there is no sign of infection.  Assessment & Plan:  Keloid  This will cont to heal.   Expectant management reviewed.   Situational depression  Reviewed with pt s/sx of burn out.   recommended counseling over meds.   Pt to contact employee resources for counseling and if that doesn't work she was offered to call and I can refer her to someone.   Total face-to-face time with patient, review of chart, discussion with consultant and coordination of care was 68min.    Margart Zemanek L. Harraway-Smith,  M.D., [name]

## 2020-06-02 ENCOUNTER — Ambulatory Visit: Payer: BC Managed Care – PPO | Admitting: Obstetrics & Gynecology

## 2020-06-09 ENCOUNTER — Ambulatory Visit: Payer: BC Managed Care – PPO | Admitting: Obstetrics & Gynecology

## 2020-07-11 ENCOUNTER — Other Ambulatory Visit: Payer: Self-pay

## 2020-07-11 ENCOUNTER — Ambulatory Visit (INDEPENDENT_AMBULATORY_CARE_PROVIDER_SITE_OTHER): Payer: BC Managed Care – PPO | Admitting: Obstetrics & Gynecology

## 2020-07-11 ENCOUNTER — Encounter: Payer: Self-pay | Admitting: Obstetrics & Gynecology

## 2020-07-11 VITALS — BP 119/79 | HR 67 | Wt 181.0 lb

## 2020-07-11 DIAGNOSIS — L910 Hypertrophic scar: Secondary | ICD-10-CM

## 2020-07-11 DIAGNOSIS — F32A Depression, unspecified: Secondary | ICD-10-CM

## 2020-07-11 DIAGNOSIS — F419 Anxiety disorder, unspecified: Secondary | ICD-10-CM | POA: Diagnosis not present

## 2020-07-11 NOTE — Progress Notes (Signed)
History:  35 y.o. G0P0000 here today for reeval of keloid scar and f/u of depression and anxiety. Pt reports that her keloid scar is looking better and she is happy with the outcome. She alos reports that her situational depression and anxiety has improved. She has recently changed positions at her job and this has eased her sx.      The following portions of the patient's history were reviewed and updated as appropriate: allergies, current medications, past family history, past medical history, past social history, past surgical history and problem list.  Review of Systems:  Pertinent items are noted in HPI.    Objective:  Physical Exam Blood pressure 119/79, pulse 67, weight 181 lb (82.1 kg), last menstrual period 06/21/2020.  CONSTITUTIONAL: Well-developed, well-nourished female in no acute distress.  HENT:  Normocephalic, atraumatic EYES: Conjunctivae and EOM are normal. No scleral icterus.  NECK: Normal range of motion SKIN: Skin is warm and dry. No rash noted. Not diaphoretic.No pallor. [place]: Alert and oriented to person, place, and time. Normal coordination.  Abd: Soft, nontender and nondistended; keloid: much smaller than prior to initial injection of steroid. The scab has resolved and healed completely.   Assessment & Plan:  Diagnoses and all orders for this visit:  Keloid scar  Anxiety and depression  Total face-to-face time with patient, review of chart, discussion about change in job and how that has affected her depression and anxiety was 15 min.   Mette Southgate L. Harraway-Smith, M.D., [name]

## 2020-07-27 ENCOUNTER — Encounter: Payer: Self-pay | Admitting: Obstetrics & Gynecology

## 2020-12-24 ENCOUNTER — Telehealth: Payer: Self-pay | Admitting: *Deleted

## 2020-12-24 NOTE — Telephone Encounter (Signed)
error 

## 2020-12-24 NOTE — Telephone Encounter (Signed)
MRI approved.

## 2020-12-25 ENCOUNTER — Ambulatory Visit (HOSPITAL_COMMUNITY)
Admission: RE | Admit: 2020-12-25 | Discharge: 2020-12-25 | Disposition: A | Discharge status: Home / Self Care | Payer: BC Managed Care – PPO | Source: Ambulatory Visit | Attending: Gynecologic Oncology | Admitting: Gynecologic Oncology

## 2020-12-25 ENCOUNTER — Other Ambulatory Visit: Payer: Self-pay

## 2020-12-25 DIAGNOSIS — Z86018 Personal history of other benign neoplasm: Secondary | ICD-10-CM | POA: Diagnosis not present

## 2020-12-25 DIAGNOSIS — D252 Subserosal leiomyoma of uterus: Secondary | ICD-10-CM | POA: Diagnosis not present

## 2020-12-25 DIAGNOSIS — D481 Neoplasm of uncertain behavior of connective and other soft tissue: Secondary | ICD-10-CM | POA: Insufficient documentation

## 2020-12-25 DIAGNOSIS — Z9889 Other specified postprocedural states: Secondary | ICD-10-CM | POA: Diagnosis not present

## 2020-12-25 DIAGNOSIS — D48113 Desmoid tumor of abdominal wall: Secondary | ICD-10-CM

## 2020-12-25 DIAGNOSIS — Z8603 Personal history of neoplasm of uncertain behavior: Secondary | ICD-10-CM | POA: Diagnosis not present

## 2020-12-25 MED ORDER — GADOBUTROL 1 MMOL/ML IV SOLN
8.0000 mL | Freq: Once | INTRAVENOUS | Status: AC | PRN
  Administered 2020-12-25: 8 mL via INTRAVENOUS

## 2020-12-25 NOTE — Telephone Encounter (Signed)
error 

## 2020-12-26 ENCOUNTER — Telehealth: Payer: Self-pay | Admitting: *Deleted

## 2020-12-26 ENCOUNTER — Ambulatory Visit: Payer: BC Managed Care – PPO | Admitting: Gynecologic Oncology

## 2020-12-26 NOTE — Telephone Encounter (Signed)
Patient called and left a message to cancel her appt for today and stated she didn't need a call back

## 2021-01-26 ENCOUNTER — Other Ambulatory Visit (HOSPITAL_COMMUNITY)
Admission: RE | Admit: 2021-01-26 | Discharge: 2021-01-26 | Disposition: A | Discharge status: Home / Self Care | Payer: BC Managed Care – PPO | Source: Ambulatory Visit | Attending: Obstetrics & Gynecology | Admitting: Obstetrics & Gynecology

## 2021-01-26 ENCOUNTER — Ambulatory Visit (INDEPENDENT_AMBULATORY_CARE_PROVIDER_SITE_OTHER): Payer: BC Managed Care – PPO | Admitting: Obstetrics & Gynecology

## 2021-01-26 ENCOUNTER — Encounter: Payer: Self-pay | Admitting: Obstetrics & Gynecology

## 2021-01-26 ENCOUNTER — Other Ambulatory Visit: Payer: Self-pay

## 2021-01-26 VITALS — BP 133/85 | HR 73 | Ht 70.0 in | Wt 197.0 lb

## 2021-01-26 DIAGNOSIS — R635 Abnormal weight gain: Secondary | ICD-10-CM

## 2021-01-26 DIAGNOSIS — Z3009 Encounter for other general counseling and advice on contraception: Secondary | ICD-10-CM | POA: Diagnosis not present

## 2021-01-26 DIAGNOSIS — Z01419 Encounter for gynecological examination (general) (routine) without abnormal findings: Secondary | ICD-10-CM | POA: Diagnosis not present

## 2021-01-26 NOTE — Progress Notes (Signed)
Subjective:     Lisa Cline is a 36 y.o. female here for a routine exam.  Current complaints: was having longer cycles that have returned to normal. Sexually active. Monogamous. Uses condoms. Does not plan to have any pregnancies at this time.      Gynecologic History Patient's last menstrual period was 12/24/2020. Contraception: condoms Last Pap: 11/26/2018. Results were: normal Last mammogram: n/a.   Obstetric History OB History  Gravida Para Term Preterm AB Living  0 0 0 0 0 0  SAB IAB Ectopic Multiple Live Births  0 0 0 0       The following portions of the patient's history were reviewed and updated as appropriate: allergies, current medications, past family history, past medical history, past social history, past surgical history, and problem list.  Review of Systems Pertinent items are noted in HPI.    Objective:  BP 133/85   Pulse 73   Ht 5\' 10"  (1.778 m)   Wt 197 lb (89.4 kg)   LMP 12/24/2020   BMI 28.27 kg/m   General Appearance:    Alert, cooperative, no distress, appears stated age  Head:    Normocephalic, without obvious abnormality, atraumatic  Eyes:    conjunctiva/corneas clear, EOM's intact, both eyes  Ears:    Normal external ear canals, both ears  Nose:   Nares normal, septum midline, mucosa normal, no drainage    or sinus tenderness  Throat:   Lips, mucosa, and tongue normal; teeth and gums normal  Neck:   Supple, symmetrical, trachea midline, no adenopathy;    thyroid:  no enlargement/tenderness/nodules  Back:     Symmetric, no curvature, ROM normal, no CVA tenderness  Lungs:     respirations unlabored  Chest Wall:    No tenderness or deformity   Heart:    Regular rate and rhythm  Breast Exam:    No tenderness, masses, or nipple abnormality  Abdomen:     Soft, non-tender, bowel sounds active all four quadrants,    no masses, no organomegaly; well healed vertical incision with keloid.   Genitalia:    Normal female without lesion, discharge or  tenderness   No palpable adnexal masses.   Extremities:   Extremities normal, atraumatic, no cyanosis or edema  Pulses:   2+ and symmetric all extremities  Skin:   Skin color, texture, turgor normal, no rashes or lesions     Assessment:    Healthy female exam.  Contraception counseling- pt reports that the number of episodes of sexual activity is limited and is well managed with condoms. Does not desire any changes at present.      Increased weight gain. Thyroid study WNL in 10/2020.    Plan:   Lisa Cline was seen today for gynecologic exam.  Diagnoses and all orders for this visit:  Well female exam with routine gynecological exam  Weight gain  Encounter for counseling regarding contraception   F/u in 1 year or sooner prn  Lisa Cline Lisa Cline, M.D., Lisa Cline

## 2021-01-26 NOTE — Addendum Note (Signed)
Addended by: Phill Myron on: 01/26/2021 09:54 AM   Modules accepted: Orders

## 2021-01-27 LAB — CYTOLOGY - PAP: Diagnosis: NEGATIVE

## 2022-01-05 ENCOUNTER — Encounter: Payer: Self-pay | Admitting: General Practice

## 2022-02-13 IMAGING — MR MR PELVIS WO/W CM
20 series · 48 of 48 positions shown · IV contrast (8ML GADAVIST)
Comparison: 12/12/2019

CLINICAL DATA: Follow-up lower abdominal wall desmoid tumor and
bilateral ovarian dermoids, all surgically resected. Previous
myomectomy for uterine fibroids.

EXAM:
MRI PELVIS WITHOUT AND WITH CONTRAST
TECHNIQUE: Multiplanar multisequence MR imaging of the pelvis was performed
both before and after administration of intravenous contrast.
CONTRAST:  8mL GADAVIST GADOBUTROL 1 MMOL/ML IV SOLN

[Series 2: T2 · coronal · 6.0mm · 1.56mm/px · 1 of 30 slices shown (1 of 4)]
[im 1/30]
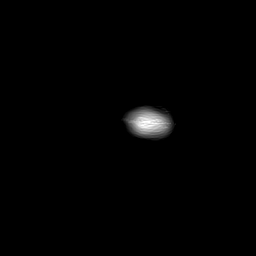

[Series 3: T2 · axial · 5.0mm · 0.51mm/px · 1 of 37 slices shown (2 of 4)]
[im 1/37]
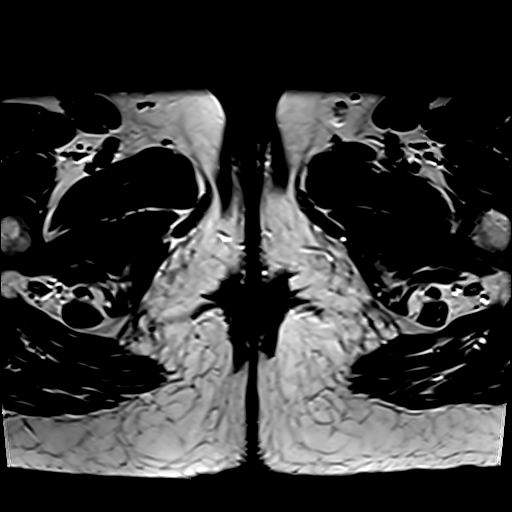

[Series 4: T2 · coronal · 4.0mm · 0.51mm/px · 1 of 40 slices shown (3 of 4)]
[im 1/40]
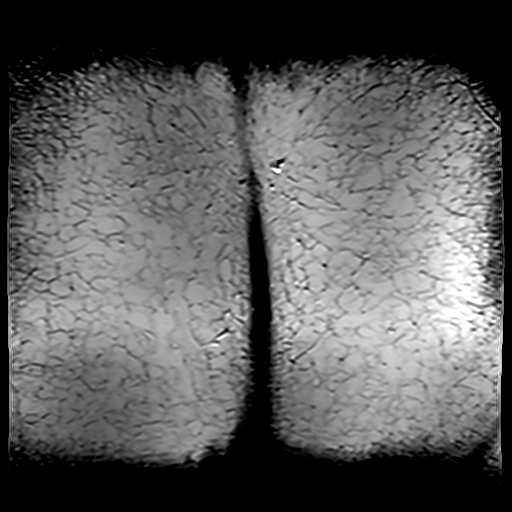

[Series 5: T2 fat-sat · axial · 5.0mm · 0.51mm/px · 1 of 37 slices shown]
[im 1/37]
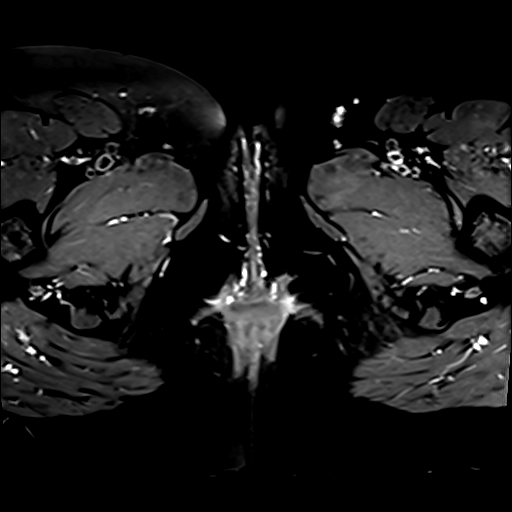

[Series 6: T2 · sagittal · 5.0mm · 0.55mm/px · 1 of 40 slices shown (4 of 4)]
[im 1/40]
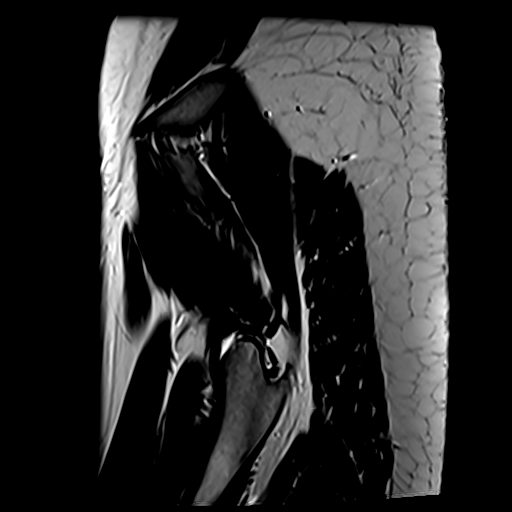

[Series 7: T1 · axial · 4.0mm · 0.84mm/px · z∈[-196,+88]mm · 3 of 72 slices shown (1 of 2)]
[im 1/72]
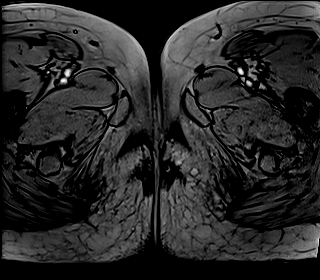
[im 36/72]
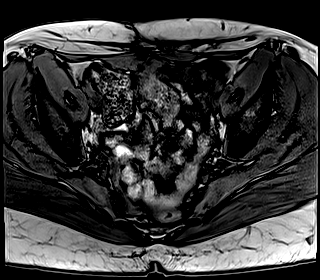
[im 72/72]
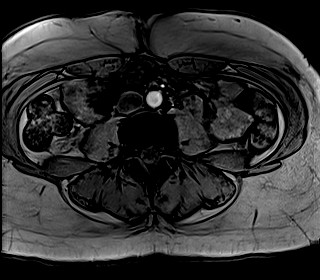

[Series 8: T1 · axial · 4.0mm · 0.84mm/px · z∈[-196,+88]mm · 3 of 72 slices shown (2 of 2)]
[im 1/72]
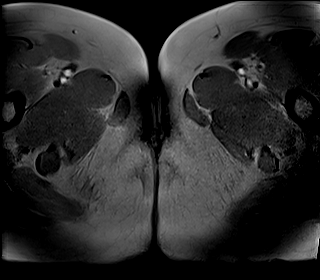
[im 36/72]
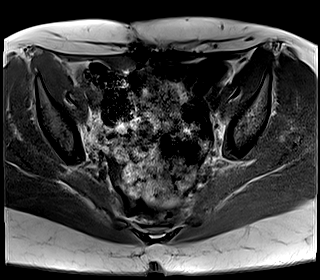
[im 72/72]
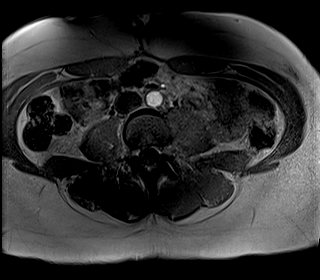

[Series 9: DWI · axial · 5.0mm · 2.80mm/px · z∈[-209,+21]mm · 5 of 134 slices shown (1 of 3)]
[im 1/134]
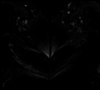
[im 34/134]
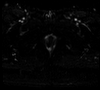
[im 67/134]
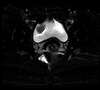
[im 100/134]
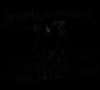
[im 134/134]
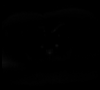

[Series 10: DWI · axial · 5.0mm · 2.80mm/px · z∈[-209,+21]mm · 2 of 47 slices shown (2 of 3)]
[im 1/47]
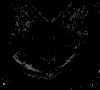
[im 47/47]
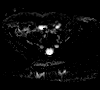

[Series 11: DWI · axial · 5.0mm · 2.80mm/px · z∈[-209,+21]mm · 2 of 47 slices shown (3 of 3)]
[im 1/47]
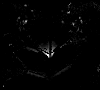
[im 47/47]
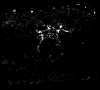

[Series 13: T1 dynamic · axial · 3.0mm · 0.84mm/px · z∈[-194,+43]mm · 3 of 80 slices shown (1 of 7)]
[im 1/80]
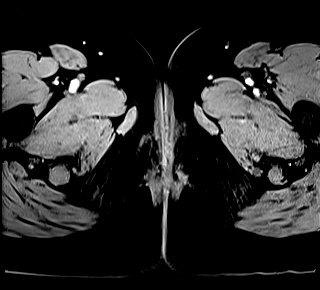
[im 40/80]
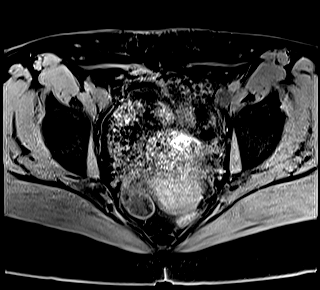
[im 80/80]
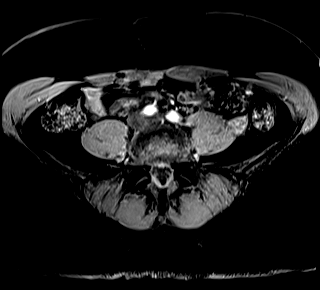

[Series 17: T1 dynamic · axial · 3.0mm · 0.84mm/px · z∈[-194,+43]mm · 3 of 80 slices shown (2 of 7)]
[im 1/80]
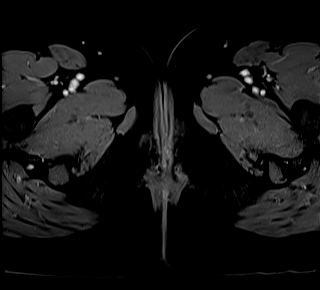
[im 40/80]
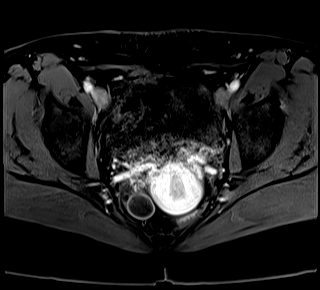
[im 80/80]
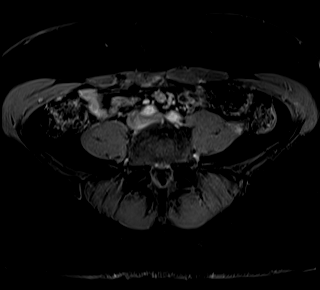

[Series 18: T1 dynamic · axial · 3.0mm · 0.84mm/px · z∈[-194,+43]mm · 3 of 80 slices shown (3 of 7)]
[im 1/80]
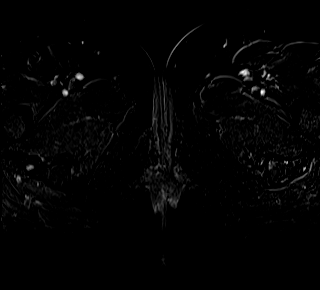
[im 40/80]
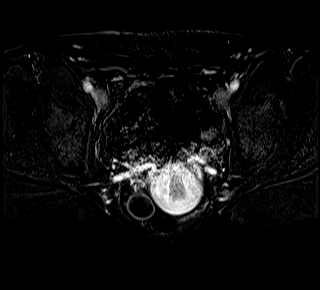
[im 80/80]
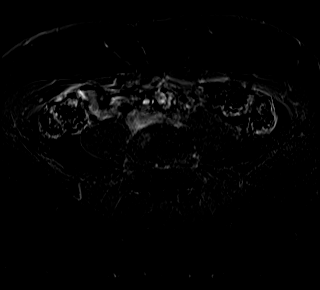

[Series 21: T1 dynamic · axial · 3.0mm · 0.84mm/px · z∈[-194,+43]mm · 3 of 80 slices shown (4 of 7)]
[im 1/80]
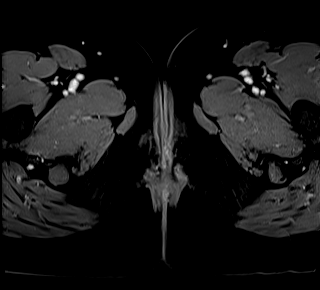
[im 40/80]
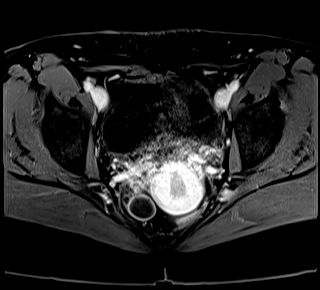
[im 80/80]
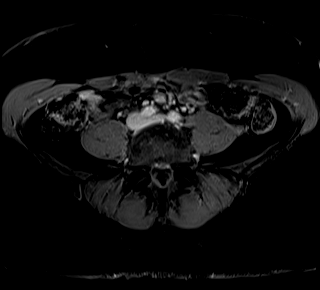

[Series 22: T1 dynamic · axial · 3.0mm · 0.84mm/px · z∈[-194,+43]mm · 3 of 80 slices shown (5 of 7)]
[im 1/80]
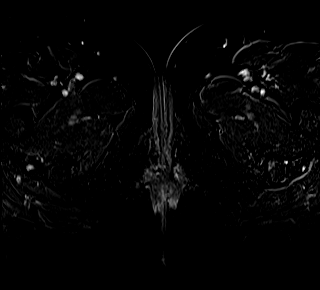
[im 40/80]
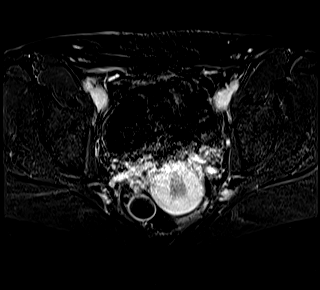
[im 80/80]
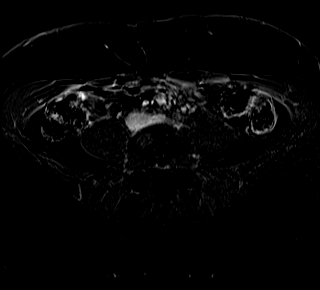

[Series 25: T1 dynamic · axial · 3.0mm · 0.84mm/px · z∈[-194,+43]mm · 3 of 80 slices shown (6 of 7)]
[im 1/80]
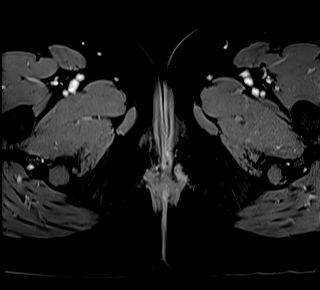
[im 40/80]
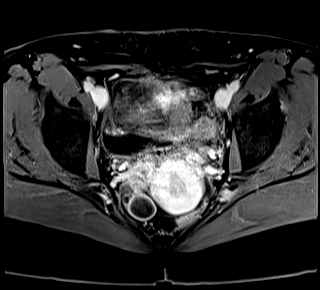
[im 80/80]
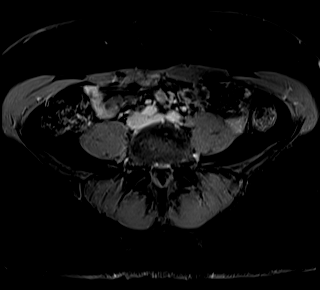

[Series 26: T1 dynamic · axial · 3.0mm · 0.84mm/px · z∈[-194,+43]mm · 3 of 80 slices shown (7 of 7)]
[im 1/80]
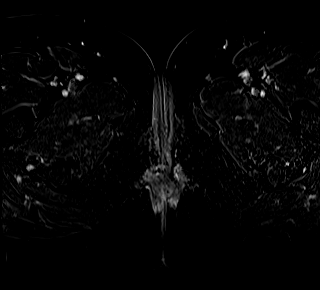
[im 40/80]
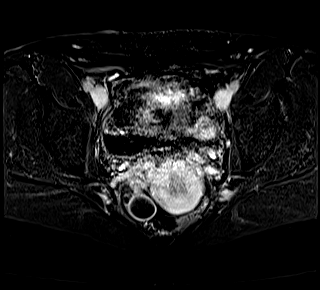
[im 80/80]
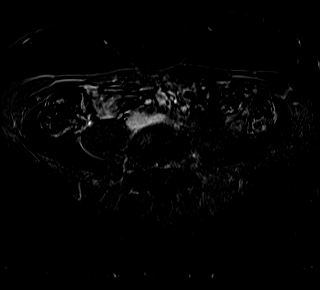

[Series 28: T1 dynamic post-contrast · axial · 3.0mm · 1.06mm/px · z∈[-185,+52]mm · 3 of 80 slices shown (1 of 2)]
[im 1/80]
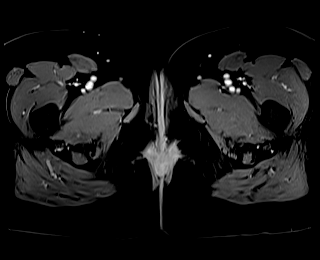
[im 40/80]
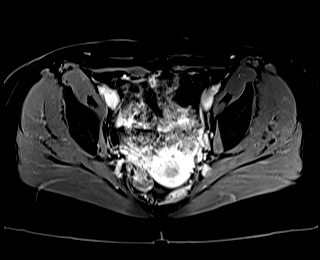
[im 80/80]
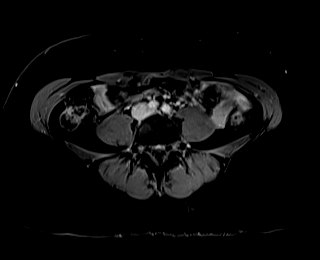

[Series 29: T2 post-contrast · sagittal · 5.0mm · 0.55mm/px · 1 of 40 slices shown]
[im 1/40]
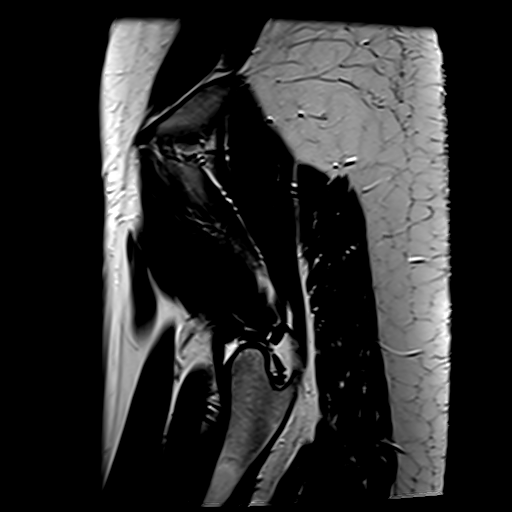

[Series 31: T1 dynamic post-contrast · sagittal · 3.0mm · 0.88mm/px · 3 of 80 slices shown (2 of 2)]
[im 1/80]
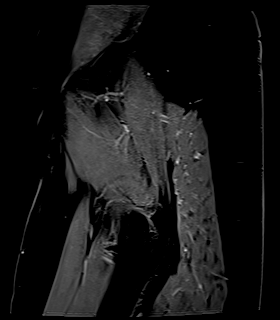
[im 40/80]
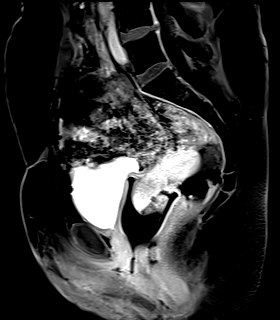
[im 80/80]
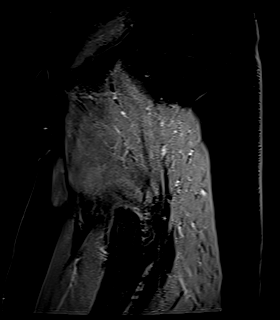

[48 of 48 positions shown; findings below may reference images not displayed]

FINDINGS: Lower Urinary Tract: No urinary bladder or urethral abnormality
identified.

Bowel: Unremarkable pelvic bowel loops.

Vascular/Lymphatic: Unremarkable. No pathologically enlarged pelvic
lymph nodes identified.

Reproductive:

-- Uterus: Measures 7.1 by 3.8 by 4.6 cm (volume = 65 cm^3). No
fibroids or other masses identified. Cervix and vagina are
unremarkable.

-- Right ovary: Appears normal. No ovarian or adnexal masses
identified.

-- Left ovary:  Not visualized, however no adnexal mass identified.

Other: Tiny amount of free fluid seen in pelvic cul-de-sac, within
the physiologic range.

Musculoskeletal: Stable postop changes are seen in the anterior
abdominal wall soft tissues. No evidence of recurrent soft tissue
mass. No evidence of hernia or abnormal fluid collections.
IMPRESSION: Stable postop changes in the anterior abdominal wall soft tissues.
No evidence of recurrent soft tissue mass or hernia.

Normal appearance of uterus and right ovary. No pelvic mass or other
significant abnormality identified.

## 2022-02-17 ENCOUNTER — Other Ambulatory Visit (HOSPITAL_COMMUNITY)
Admission: RE | Admit: 2022-02-17 | Discharge: 2022-02-17 | Disposition: A | Discharge status: Home / Self Care | Payer: BC Managed Care – PPO | Source: Ambulatory Visit | Attending: Obstetrics & Gynecology | Admitting: Obstetrics & Gynecology

## 2022-02-17 ENCOUNTER — Encounter: Payer: Self-pay | Admitting: Obstetrics & Gynecology

## 2022-02-17 ENCOUNTER — Ambulatory Visit (INDEPENDENT_AMBULATORY_CARE_PROVIDER_SITE_OTHER): Payer: BC Managed Care – PPO | Admitting: Obstetrics & Gynecology

## 2022-02-17 VITALS — BP 137/97 | HR 69 | Ht 70.0 in | Wt 210.0 lb

## 2022-02-17 DIAGNOSIS — R198 Other specified symptoms and signs involving the digestive system and abdomen: Secondary | ICD-10-CM | POA: Diagnosis not present

## 2022-02-17 DIAGNOSIS — Z01419 Encounter for gynecological examination (general) (routine) without abnormal findings: Secondary | ICD-10-CM

## 2022-02-17 DIAGNOSIS — N898 Other specified noninflammatory disorders of vagina: Secondary | ICD-10-CM | POA: Insufficient documentation

## 2022-02-17 DIAGNOSIS — R150 Incomplete defecation: Secondary | ICD-10-CM

## 2022-02-17 NOTE — Progress Notes (Signed)
Subjective:     Lisa Cline is a 36 y.o. female here for a routine exam.  Current complaints: Pt report incomplete sensation after defecation. She also reports need to 'assist' with pushing the perineum to assist with passage of BM.  She is not sure how long this has lasted.    Pt lives with SO. She is considering marriage. She is in a new role at work and likes this much better.        Gynecologic History Patient's last menstrual period was 02/04/2022. Contraception: condoms Last Pap: 01/26/2021. Results were: normal Last mammogram: n/a.   Obstetric History OB History  Gravida Para Term Preterm AB Living  0 0 0 0 0 0  SAB IAB Ectopic Multiple Live Births  0 0 0 0     The following portions of the patient's history were reviewed and updated as appropriate: allergies, current medications, past family history, past medical history, past social history, past surgical history, and problem list.  Review of Systems Pertinent items are noted in HPI.    Objective:  BP (!) 137/97   Pulse 69   Ht '5\' 10"'$  (1.778 m)   Wt 210 lb (95.3 kg)   LMP 02/04/2022   BMI 30.13 kg/m   General Appearance:    Alert, cooperative, no distress, appears stated age  Head:    Normocephalic, without obvious abnormality, atraumatic  Eyes:    conjunctiva/corneas clear, EOM's intact, both eyes  Ears:    Normal external ear canals, both ears  Nose:   Nares normal, septum midline, mucosa normal, no drainage    or sinus tenderness  Throat:   Lips, mucosa, and tongue normal; teeth and gums normal  Neck:   Supple, symmetrical, trachea midline, no adenopathy;    thyroid:  no enlargement/tenderness/nodules  Back:     Symmetric, no curvature, ROM normal, no CVA tenderness  Lungs:     respirations unlabored  Chest Wall:    No tenderness or deformity   Heart:    Regular rate and rhythm  Breast Exam:    No tenderness, masses, or nipple abnormality  Abdomen:     Soft, non-tender, bowel sounds active all four  quadrants,    no masses, no organomegaly. Well healed vertical incision.   Genitalia:    Normal female without lesion, discharge or tenderness   Rectal exam done. Tight ridge noted in the rectum. No masses palpated.   Extremities:   Extremities normal, atraumatic, no cyanosis or edema  Pulses:   2+ and symmetric all extremities  Skin:   Skin color, texture, turgor normal, no rashes or lesions     Assessment:    Healthy female exam.  Incomplete rectal emptying- after reviewing her history. I have discussed with her the risks and recommended a colonoscopy and a referral to GI.    Plan:   [name] was seen today for gynecologic exam.  Diagnoses and all orders for this visit:  Well female exam with routine gynecological exam -     Cancel: Cervicovaginal ancillary only( [place])  Incomplete defecation -     Ambulatory referral to Gastroenterology  Rectal pressure -     Ambulatory referral to Gastroenterology  Vaginal discharge -     Cervicovaginal ancillary only( [place])  F/u in 1 year.    Theron Cumbie L. Harraway-Smith, M.D., [name]

## 2022-02-19 LAB — CERVICOVAGINAL ANCILLARY ONLY
Bacterial Vaginitis (gardnerella): NEGATIVE
Candida Glabrata: NEGATIVE
Candida Vaginitis: NEGATIVE
Comment: NEGATIVE
Comment: NEGATIVE
Comment: NEGATIVE
Comment: NEGATIVE
Trichomonas: NEGATIVE

## 2022-03-14 ENCOUNTER — Encounter: Payer: Self-pay | Admitting: Obstetrics & Gynecology

## 2022-03-17 ENCOUNTER — Other Ambulatory Visit: Payer: Self-pay | Admitting: Obstetrics & Gynecology

## 2022-03-17 DIAGNOSIS — N761 Subacute and chronic vaginitis: Secondary | ICD-10-CM

## 2022-03-17 MED ORDER — CLOTRIMAZOLE-BETAMETHASONE 1-0.05 % EX CREA: 1.0000 | TOPICAL_CREAM | Freq: Two times a day (BID) | CUTANEOUS | 2 refills | Status: AC

## 2022-06-15 ENCOUNTER — Ambulatory Visit: Payer: BC Managed Care – PPO | Admitting: Student

## 2022-06-24 ENCOUNTER — Other Ambulatory Visit (HOSPITAL_COMMUNITY)
Admission: RE | Admit: 2022-06-24 | Discharge: 2022-06-24 | Disposition: A | Discharge status: Home / Self Care | Payer: BC Managed Care – PPO | Source: Ambulatory Visit | Attending: Student | Admitting: Student

## 2022-06-24 ENCOUNTER — Ambulatory Visit (INDEPENDENT_AMBULATORY_CARE_PROVIDER_SITE_OTHER): Payer: BC Managed Care – PPO | Admitting: Obstetrics and Gynecology

## 2022-06-24 ENCOUNTER — Encounter: Payer: Self-pay | Admitting: Obstetrics and Gynecology

## 2022-06-24 VITALS — BP 129/85 | HR 80 | Ht 70.0 in | Wt 219.0 lb

## 2022-06-24 DIAGNOSIS — N761 Subacute and chronic vaginitis: Secondary | ICD-10-CM | POA: Insufficient documentation

## 2022-06-24 NOTE — Progress Notes (Signed)
37 yo P0 here for evaluation of a greenish discharge. She denies pelvic pain. She denies any odor. She admits to some pruritus near clitoral hood. This has been on going for 2 months. She is sexually active using condoms. She is without any other complaints. She has a monthly period lasting 5 days.  Past Medical History:  Diagnosis Date   Desmoid tumor of abdomen    Family history of breast cancer    Family history of colonic polyps    Family history of uterine cancer    Past Surgical History:  Procedure Laterality Date   COLON RESECTION SIGMOID  08/09/2017   Procedure: COLON RESECTION SIGMOID W SIDE TO SIDE ANASTOMOSIS;  Surgeon: Adolphus Birchwood, MD;  Location: [facility];  Service: Gynecology;;   LAPAROTOMY N/A 04/23/2014   Procedure: LAPAROTOMY;  Surgeon: Willodean Rosenthal, MD;  Location: [facility];  Service: Gynecology;  Laterality: N/A;   LAPAROTOMY N/A 08/09/2017   Procedure: EXPLORATORY LAPAROTOMY;  Surgeon: Adolphus Birchwood, MD;  Location: [facility];  Service: Gynecology;  Laterality: N/A;   MASS EXCISION N/A 08/09/2017   Procedure: EXCISION OF PELVIC MASS;  Surgeon: Adolphus Birchwood, MD;  Location: [facility];  Service: Gynecology;  Laterality: N/A;   MYOMECTOMY N/A 08/09/2017   Procedure: POSSIBLE MYOMECTOMY;  Surgeon: Adolphus Birchwood, MD;  Location: [facility];  Service: Gynecology;  Laterality: N/A;   NO PAST SURGERIES     OVARIAN CYST REMOVAL Left 04/23/2014   Procedure: OVARIAN CYSTECTOMY;  Surgeon: Willodean Rosenthal, MD;  Location: [facility];  Service: Gynecology;  Laterality: Left;   OVARIAN CYST REMOVAL Right 08/09/2017   Procedure: RIGHT OVARIAN CYSTECTOMY;  Surgeon: Adolphus Birchwood, MD;  Location: [facility];  Service: Gynecology;  Laterality: Right;   SALPINGOOPHORECTOMY Right 04/23/2014   Procedure: SALPINGO OOPHORECTOMY;  Surgeon: Willodean Rosenthal, MD;  Location: [facility];  Service: Gynecology;  Laterality: Right;   Family History  Problem Relation Age of Onset   Hypertension Father    Hypertension Mother     Heart attack Mother 92   Hypertension Brother    Breast cancer Maternal Grandmother 50       d. 64   Heart attack Maternal Grandfather    Heart attack Paternal Grandmother    Heart attack Paternal Grandfather    Uterine cancer Paternal Aunt 12   Uterine cancer Other        PGFs sister   Colon polyps Cousin        pat first cousin with multiple polyps   Blood pressure 129/85, pulse 80, height 5\' 10"  (1.778 m), weight 219 lb (99.3 kg), last menstrual period 06/14/2022. GENERAL: Well-developed, well-nourished female in no acute distress.  LUNGS: Clear to auscultation bilaterally.  HEART: Regular rate and rhythm. ABDOMEN: Soft, nontender, nondistended. No organomegaly. PELVIC: Normal external female genitalia. Vagina is pink and rugated.  Normal discharge. Chaperone present during the pelvic exam EXTREMITIES: No cyanosis, clubbing, or edema, 2+ distal pulses.  A/P 37 yo here for vaginitis - Vaginal swab collected - patient advised to use monistat cream to pruritic area - patient will be contacted with abnormal results - Patient to return in 7 months for annual exam

## 2022-06-24 NOTE — Progress Notes (Signed)
Patient having some vaginal discharge for approx 8 weeks. Armandina Stammer RN

## 2022-06-25 LAB — CERVICOVAGINAL ANCILLARY ONLY
Bacterial Vaginitis (gardnerella): NEGATIVE
Candida Glabrata: NEGATIVE
Candida Vaginitis: POSITIVE — AB
Chlamydia: NEGATIVE
Comment: NEGATIVE
Comment: NEGATIVE
Comment: NEGATIVE
Comment: NEGATIVE
Comment: NEGATIVE
Comment: NORMAL
Neisseria Gonorrhea: NEGATIVE
Trichomonas: NEGATIVE

## 2022-06-28 MED ORDER — FLUCONAZOLE 150 MG PO TABS: 150.0000 mg | ORAL_TABLET | Freq: Once | ORAL | 0 refills | Status: AC

## 2022-06-28 NOTE — Addendum Note (Signed)
Addended by: Catalina Antigua on: 06/28/2022 08:42 AM   Modules accepted: Orders

## 2022-07-23 DIAGNOSIS — R059 Cough, unspecified: Secondary | ICD-10-CM | POA: Diagnosis not present

## 2022-07-23 DIAGNOSIS — Z20822 Contact with and (suspected) exposure to covid-19: Secondary | ICD-10-CM | POA: Diagnosis not present

## 2022-07-23 DIAGNOSIS — B9789 Other viral agents as the cause of diseases classified elsewhere: Secondary | ICD-10-CM | POA: Diagnosis not present

## 2022-07-23 DIAGNOSIS — J988 Other specified respiratory disorders: Secondary | ICD-10-CM | POA: Diagnosis not present

## 2022-10-19 DIAGNOSIS — Z713 Dietary counseling and surveillance: Secondary | ICD-10-CM | POA: Diagnosis not present

## 2022-10-19 DIAGNOSIS — Z131 Encounter for screening for diabetes mellitus: Secondary | ICD-10-CM | POA: Diagnosis not present

## 2022-10-19 DIAGNOSIS — Z13220 Encounter for screening for lipoid disorders: Secondary | ICD-10-CM | POA: Diagnosis not present

## 2022-10-19 DIAGNOSIS — Z136 Encounter for screening for cardiovascular disorders: Secondary | ICD-10-CM | POA: Diagnosis not present

## 2022-10-19 DIAGNOSIS — Z6830 Body mass index (BMI) 30.0-30.9, adult: Secondary | ICD-10-CM | POA: Diagnosis not present

## 2023-06-15 ENCOUNTER — Ambulatory Visit (INDEPENDENT_AMBULATORY_CARE_PROVIDER_SITE_OTHER): Admitting: Obstetrics & Gynecology

## 2023-06-15 ENCOUNTER — Encounter: Payer: Self-pay | Admitting: Obstetrics & Gynecology

## 2023-06-15 ENCOUNTER — Other Ambulatory Visit (HOSPITAL_COMMUNITY)
Admission: RE | Admit: 2023-06-15 | Discharge: 2023-06-15 | Disposition: A | Discharge status: Home / Self Care | Source: Ambulatory Visit | Attending: Obstetrics & Gynecology | Admitting: Obstetrics & Gynecology

## 2023-06-15 VITALS — BP 128/78 | HR 75 | Ht 70.0 in | Wt 221.0 lb

## 2023-06-15 DIAGNOSIS — Z01419 Encounter for gynecological examination (general) (routine) without abnormal findings: Secondary | ICD-10-CM | POA: Diagnosis not present

## 2023-06-15 DIAGNOSIS — K921 Melena: Secondary | ICD-10-CM | POA: Diagnosis not present

## 2023-06-15 NOTE — Progress Notes (Signed)
 Subjective:     Lisa Cline is a 38 y.o. female here for a routine exam.  Current complaints: Pt reports pain in labia minora for about 30 days. She felt a small lump that seemed to go away then return. It is a little sore today but, not bad. She was also reading on perimenopause and wonders if she should be concerned. She cont to report a sensation of incomplete emptying of her stool. She occ sees blood on her stool. Was referred to GI for a colonoscopy but, reports that she did not reach them or vice versa.      She reports a 40# weight gain due to less activity.     Gynecologic History Patient's last menstrual period was 05/22/2023. Contraception: none Last Pap: 01/26/2021. Results were: normal Last mammogram: n/a  Obstetric History OB History  Gravida Para Term Preterm AB Living  0 0 0 0 0 0  SAB IAB Ectopic Multiple Live Births  0 0 0 0      The following portions of the patient's history were reviewed and updated as appropriate: allergies, current medications, past family history, past medical history, past social history, past surgical history, and problem list.  Review of Systems Pertinent items are noted in HPI.    Objective:  BP 128/78   Pulse 75   Ht 5\' 10"  (1.778 m)   Wt 221 lb (100.2 kg)   LMP 05/22/2023   BMI 31.71 kg/m   General Appearance:    Alert, cooperative, no distress, appears stated age  Head:    Normocephalic, without obvious abnormality, atraumatic  Eyes:    conjunctiva/corneas clear, EOM's intact, both eyes  Ears:    Normal external ear canals, both ears  Nose:   Nares normal, septum midline, mucosa normal, no drainage    or sinus tenderness  Throat:   Lips, mucosa, and tongue normal; teeth and gums normal  Neck:   Supple, symmetrical, trachea midline, no adenopathy;    thyroid:  no enlargement/tenderness/nodules  Back:     Symmetric, no curvature, ROM normal, no CVA tenderness  Lungs:     respirations unlabored  Chest Wall:    No  tenderness or deformity   Heart:    Regular rate and rhythm  Breast Exam:    No tenderness, masses, or nipple abnormality  Abdomen:     Soft, non-tender, bowel sounds active all four quadrants,    no masses, no organomegaly. Vertical keloid markedly improved.   Genitalia:    Normal female without lesion, discharge or tenderness     Extremities:   Extremities normal, atraumatic, no cyanosis or edema  Pulses:   2+ and symmetric all extremities  Skin:   Skin color, texture, turgor normal, no rashes or lesions     Assessment:    Healthy female exam.  Blood in stool and incomplete rectal emptying.     Plan:  Chakia was seen today for gynecologic exam.  Diagnoses and all orders for this visit:  Blood in stool -     Ambulatory referral to Gastroenterology  Well female exam with routine gynecological exam -     Cytology - PAP( [place])   F/u in 1 year or sooner prn     Jandel Patriarca L. Harraway-Smith, M.D., FACOG

## 2023-06-20 LAB — CYTOLOGY - PAP
Comment: NEGATIVE
Diagnosis: NEGATIVE
High risk HPV: NEGATIVE

## 2023-06-29 ENCOUNTER — Encounter: Payer: Self-pay | Admitting: Obstetrics & Gynecology

## 2023-08-12 ENCOUNTER — Encounter: Payer: Self-pay | Admitting: Obstetrics & Gynecology

## 2024-02-06 ENCOUNTER — Other Ambulatory Visit: Payer: Self-pay

## 2024-02-06 DIAGNOSIS — N898 Other specified noninflammatory disorders of vagina: Secondary | ICD-10-CM

## 2024-02-06 MED ORDER — FLUCONAZOLE 150 MG PO TABS: 150.0000 mg | ORAL_TABLET | Freq: Once | ORAL | 0 refills | Status: AC

## 2024-02-06 MED ORDER — FLUCONAZOLE 150 MG PO TABS: 150.0000 mg | ORAL_TABLET | Freq: Once | ORAL | 0 refills | Status: DC
